# Patient Record
Sex: Female | Born: 1995 | Race: White | Hispanic: No | Marital: Single | Smoking: Never smoker
Health system: Southern US, Community
[De-identification: ages and names within clinical notes are randomized; demographics above are authoritative.]

## PROBLEM LIST (undated history)

## (undated) DIAGNOSIS — H269 Unspecified cataract: Secondary | ICD-10-CM

## (undated) DIAGNOSIS — R55 Syncope and collapse: Secondary | ICD-10-CM

## (undated) DIAGNOSIS — F329 Major depressive disorder, single episode, unspecified: Secondary | ICD-10-CM

## (undated) DIAGNOSIS — F419 Anxiety disorder, unspecified: Secondary | ICD-10-CM

## (undated) DIAGNOSIS — F32A Depression, unspecified: Secondary | ICD-10-CM

## (undated) HISTORY — DX: Unspecified cataract: H26.9

## (undated) HISTORY — DX: Anxiety disorder, unspecified: F41.9

## (undated) HISTORY — DX: Syncope and collapse: R55

## (undated) HISTORY — PX: WISDOM TOOTH EXTRACTION: SHX21

## (undated) HISTORY — DX: Depression, unspecified: F32.A

## (undated) HISTORY — PX: NO PAST SURGERIES: SHX2092

---
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
## 2017-06-13 ENCOUNTER — Ambulatory Visit (INDEPENDENT_AMBULATORY_CARE_PROVIDER_SITE_OTHER): Payer: Managed Care, Other (non HMO) | Admitting: Family Medicine

## 2017-06-13 ENCOUNTER — Encounter: Payer: Self-pay | Admitting: Family Medicine

## 2017-06-13 VITALS — BP 100/62 | HR 86 | Temp 98.7°F | Resp 18 | Ht 63.39 in | Wt 96.2 lb

## 2017-06-13 DIAGNOSIS — G8929 Other chronic pain: Secondary | ICD-10-CM

## 2017-06-13 DIAGNOSIS — F411 Generalized anxiety disorder: Secondary | ICD-10-CM

## 2017-06-13 DIAGNOSIS — M25561 Pain in right knee: Secondary | ICD-10-CM | POA: Diagnosis not present

## 2017-06-13 DIAGNOSIS — M25562 Pain in left knee: Secondary | ICD-10-CM

## 2017-06-13 DIAGNOSIS — R634 Abnormal weight loss: Secondary | ICD-10-CM | POA: Diagnosis not present

## 2017-06-13 DIAGNOSIS — R42 Dizziness and giddiness: Secondary | ICD-10-CM

## 2017-06-13 MED ORDER — ESCITALOPRAM OXALATE 5 MG PO TABS: 5.0000 mg | ORAL_TABLET | Freq: Every day | ORAL | 5 refills | Status: DC

## 2017-06-13 MED ORDER — MELOXICAM 7.5 MG PO TABS: 7.5000 mg | ORAL_TABLET | Freq: Every day | ORAL | 2 refills | Status: DC

## 2017-06-13 NOTE — Patient Instructions (Addendum)
     IF you received an x-ray today, you will receive an invoice from [facility]. Please contact [facility] at [phone] with questions or concerns regarding your invoice.   IF you received labwork today, you will receive an invoice from [facility]. Please contact [facility] at [phone] with questions or concerns regarding your invoice.   Our billing staff will not be able to assist you with questions regarding bills from these companies.  You will be contacted with the lab results as soon as they are available. The fastest way to get your results is to activate your My Chart account. Instructions are located on the last page of this paperwork. If you have not heard from us regarding the results in 2 weeks, please contact this office.     Recommend: Small frequent meals with protein and complex carb.  60 grams of protein per day.  [email or web address]  Drink 3 liters of fluid daily. Every 1 - 2 hours you should drink approximately 16 ounces of fluid. Drink 8 - 16 ounces before getting out of bed in the morning; something with electrolytes (G2, Propel, Pedialyte, for example) is preferable. Decrease your caffeine intake to 8 ounces of daily. Caffeine acts as a diuretic and eliminates fluids that you've worked hard to take in.   You should have 3 - 4 grams of sodium in your diet daily. One teaspoon of salt = 2300mg . Place 1 tsp of salt in a bag and sprinkle on your food throughout the day. Remember a typical American diet (including processed foods) has approximately 3400mg  of sodium daily.   Compression Socks/Stockings:  Wear throughout the day while you are upright (standing or walking). Stocking pressures range from 10-2225mmHg (over the counter) to 30-7540mmHg and higher (prescription strength). You may prefer beginning with the compression socks or stockings at a lower pressure and advancing to the higher/prescription strength later. Putting them on first thing in the  morning is ideal. Remember to remove them prior to lying down or going to bed.  Consider abdominal binder.   Monitor your blood pressure and heart rate 1 - 2 times daily. Record the time of day, position you are in (lying, sitting, standing) and any symptoms that you may be experiencing.

## 2017-06-13 NOTE — Progress Notes (Signed)
Subjective:    Patient ID: Pamela Farmer, female    DOB: 1996/06/15, 21 y.o.   MRN: [id number]  06/13/2017  Weight Loss (follow-up) and Dizziness   HPI This 21 y.o. female presents for evaluation of weight loss and dizziness.  Has been working a lot.  Was working at an [employer] where pt worked a lot.  Now working at [employer] where running around constantly at [employer].  Lightheaded and dizziness.  Also wanted a second opinion.  Has been passing out and dizzy spells; onset since elementary school.  S/p [facility].   Recommend: Small frequent meals with protein and complex carbs.  (breakfast bowls,  Drink 3 liters of fluid daily. Decrease cafeine intake to 8 ounces per day. 3-4 grams of sodium in your diet daily. Wear compression stockings daily throughout the day. Consider abdominal binder.   Check BP with pulse twice daily; not journaling. Complete echocardiogram.  With cardiology,underwent tilt table and negative; no cardiological source of syncope.  Tried to make pt pass out; did not pass out.  Pill under tongue.  Hand in ice.  No induced syncopal event.  Will pass out with ear piercing; will pass out with hip dislocation; will hit funny bone at work and passes out.  Passes out with vaccinations.    S/p neurology consultation at [facility].  Performed exam in office.  Asked about head trauma; does hit head multiple with syncope.  No EEG.  No imaging or MRI. Checked sensation in extremities.  January or February 2018.    Daily dizziness occurs whenever going to bed.  Will get really dizzy; occurs around 8:00pm or 8:30pm; still moving around.  Dizziness is really hard to explain.  Can occur at other times of day.  Last week had to lay down with severe dizziness; that day occurred at 10:30am when getting ready for work.  One month, had a really bad dizzy spell duration 20 minutes.  Temperature decreased to 92-93 degrees. Occurred after getting out of shower and going into cold  room.   Headaches and shakiness.  Fogginess.  Will get cold and shaking.  Not able to think clear. With syncopal event, not witnessed seizure activity; no tongue biting; +incontinence urine; no fecal incontinence.  A lot of nausea and fatigue.    B:Jimmy [employer] eggs, cheese, bacon, sausage, potatoes.  340 calories, protein not sure, sodium not sure Snack: bagel with cream cheese, crackers peanut butter or cheese; apples and peanut butter Lunch: sandwich tomatoes, 2-3 deli meat, american cheese, chips salt and vinegar. Snack:chips or small; Supper: pot roast, celery, carrots, potatoes, garlic.   Snack:  Crackers or velveta soft break.  Sometimes eating is a a struggle.  Working excessively. Got in habit of missng lunch or breakfast. Has a 5:00am-1:00pm.  Stake n Shake is 24 hour. Still in that habit.  Work is so hectic.   BP Readings from Last 3 Encounters:  06/13/17 100/62  09/25/16 122/72  11/08/15 114/68   Wt Readings from Last 3 Encounters:  06/13/17 96 lb 3.2 oz (43.6 kg) (2 %, Z= -2.12)*  09/25/16 107 lb (48.5 kg) (14 %, Z= -1.08)*  11/08/15 119 lb (54 kg) (43 %, Z= -0.18)*   * Growth percentiles are based on CDC 2-20 Years data.   Immunization History  Administered Date(s) Administered  . DTaP 08/27/1998, 10/27/1998, 12/29/1998, 12/30/1999, 08/06/2003  . HPV Quadrivalent 10/17/2010, 01/05/2011, 07/11/2011  . Hepatitis A 10/17/2010, 07/11/2011  . Hepatitis B 12/29/1998, 05/16/1999, 12/30/1999  .  HiB (PRP-OMP) 08/27/1998, 10/27/1998, 07/13/1999  . IPV 08/27/1998, 10/27/1998, 07/13/1999, 08/06/2003  . MMR 07/13/1999, 08/06/2003  . Meningococcal Conjugate 07/11/2011  . Pneumococcal-Unspecified 05/16/1999, 12/30/1999  . Td 07/16/2009  . Tdap 07/16/2009  . Varicella 07/13/1999, 08/06/2003      Review of Systems  Constitutional: Negative for chills, diaphoresis, fatigue and fever.  Eyes: Negative for visual disturbance.  Respiratory: Negative for cough and shortness of  breath.   Cardiovascular: Negative for chest pain, palpitations and leg swelling.  Gastrointestinal: Negative for abdominal pain, constipation, diarrhea, nausea and vomiting.  Endocrine: Negative for cold intolerance, heat intolerance, polydipsia, polyphagia and polyuria.  Neurological: Negative for dizziness, tremors, seizures, syncope, facial asymmetry, speech difficulty, weakness, light-headedness, numbness and headaches.    Past Medical History:  Diagnosis Date  . Anxiety    Past Surgical History:  Procedure Laterality Date  . NO PAST SURGERIES    . WISDOM TOOTH EXTRACTION     No Known Allergies Current Outpatient Prescriptions  Medication Sig Dispense Refill  . etonogestrel (IMPLANON) 68 MG IMPL implant Inject into the skin.    . meloxicam (MOBIC) 7.5 MG tablet Take 1 tablet (7.5 mg total) by mouth daily. 30 tablet 2  . escitalopram (LEXAPRO) 5 MG tablet Take 1 tablet (5 mg total) by mouth at bedtime. 30 tablet 5   No current facility-administered medications for this visit.    Social History   Social History  . Marital status: Single    Spouse name: N/A  . Number of children: N/A  . Years of education: 12th Grade   Occupational History  . Full Time Student     [employer]   Social History Main Topics  . Smoking status: Never Smoker  . Smokeless tobacco: Never Used  . Alcohol use No  . Drug use: No  . Sexual activity: Not on file   Other Topics Concern  . Not on file   Social History Narrative  . No narrative on file   Family History  Problem Relation Age of Onset  . Bipolar disorder Mother   . Macular degeneration Mother   . Mental illness Mother   . Hypertension Father   . ADD / ADHD Father   . Hyperlipidemia Father   . Healthy Sister   . Mental illness Sister   . Healthy Brother   . Cancer Maternal Grandmother   . Mental illness Maternal Grandmother   . Cancer Maternal Grandfather   . Hyperlipidemia Paternal Grandmother   . Stroke  Paternal Grandfather        Objective:    BP 100/62   Pulse 86   Temp 98.7 F (37.1 C) (Oral)   Resp 18   Ht 5' 3.39" (1.61 m)   Wt 96 lb 3.2 oz (43.6 kg)   LMP 06/11/2017 (Approximate)   SpO2 100%   BMI 16.83 kg/m  Physical Exam  Constitutional: She is oriented to person, place, and time. She appears well-developed and well-nourished. No distress.  HENT:  Head: Normocephalic and atraumatic.  Right Ear: External ear normal.  Left Ear: External ear normal.  Nose: Nose normal.  Mouth/Throat: Oropharynx is clear and moist.  Eyes: Pupils are equal, round, and reactive to light. Conjunctivae and EOM are normal.  Neck: Normal range of motion. Neck supple. Carotid bruit is not present. No thyromegaly present.  Cardiovascular: Normal rate, regular rhythm, normal heart sounds and intact distal pulses.  Exam reveals no gallop and no friction rub.   No murmur heard. Pulmonary/Chest: Effort  normal and breath sounds normal. She has no wheezes. She has no rales.  Abdominal: Soft. Bowel sounds are normal. She exhibits no distension and no mass. There is no tenderness. There is no rebound and no guarding.  Lymphadenopathy:    She has no cervical adenopathy.  Neurological: She is alert and oriented to person, place, and time. No cranial nerve deficit. She exhibits normal muscle tone. Coordination normal.  Skin: Skin is warm and dry. No rash noted. She is not diaphoretic. No erythema. No pallor.  Psychiatric: She has a normal mood and affect. Her behavior is normal. Judgment and thought content normal.   Results for orders placed or performed in visit on 09/25/16  CBC with Differential/Platelet  Result Value Ref Range   WBC 6.2 4.5 - 13.0 K/uL   RBC 4.48 3.80 - 5.10 MIL/uL   Hemoglobin 13.6 11.5 - 15.3 g/dL   HCT 39.8 34.0 - 46.0 %   MCV 88.8 78.0 - 98.0 fL   MCH 30.4 25.0 - 35.0 pg   MCHC 34.2 31.0 - 36.0 g/dL   RDW 13.1 11.0 - 15.0 %   Platelets 397 140 - 400 K/uL   MPV 8.5 7.5 -  12.5 fL   Neutro Abs 3,410 1,800 - 8,000 cells/uL   Lymphs Abs 2,108 1,200 - 5,200 cells/uL   Monocytes Absolute 496 200 - 900 cells/uL   Eosinophils Absolute 124 15 - 500 cells/uL   Basophils Absolute 62 0 - 200 cells/uL   Neutrophils Relative % 55 %   Lymphocytes Relative 34 %   Monocytes Relative 8 %   Eosinophils Relative 2 %   Basophils Relative 1 %   Smear Review Criteria for review not met   Comprehensive metabolic panel  Result Value Ref Range   Sodium 139 135 - 146 mmol/L   Potassium 4.3 3.8 - 5.1 mmol/L   Chloride 107 98 - 110 mmol/L   CO2 23 20 - 31 mmol/L   Glucose, Bld 74 65 - 99 mg/dL   BUN 12 7 - 20 mg/dL   Creat 0.63 0.50 - 1.00 mg/dL   Total Bilirubin 0.9 0.2 - 1.1 mg/dL   Alkaline Phosphatase 50 47 - 176 U/L   AST 15 12 - 32 U/L   ALT 8 5 - 32 U/L   Total Protein 7.0 6.3 - 8.2 g/dL   Albumin 4.7 3.6 - 5.1 g/dL   Calcium 9.7 8.9 - 10.4 mg/dL  Hemoglobin A1c  Result Value Ref Range   Hgb A1c MFr Bld 4.6 <5.7 %   Mean Plasma Glucose 85 mg/dL  TSH  Result Value Ref Range   TSH 3.63 0.50 - 4.30 mIU/L   Depression screen [facility] 2/9 06/13/2017 09/25/2016  Decreased Interest 0 0  Down, Depressed, Hopeless 0 0  PHQ - 2 Score 0 0       Assessment & Plan:   1. Loss of weight   2. Generalized anxiety disorder   3. Chronic pain of both knees   4. Dizziness    -weight loss with change in jobs in the past six months; reviewed food intake; discussed the need to increase high protein and high fat meals and snacks throughout the day. -pt admits to chronic ongoing anxiety; agreeable to trial of Lexapro 81m daily and referral for counseling. -[facility] with recommendations.  Copy of consultation note provided to patient and mother.  [facility]  Obtain records.  Orders Placed This Encounter  Procedures  . Ambulatory referral to Psychology    Referral  Priority:   Routine    Referral Type:   Psychiatric    Referral Reason:   Specialty Services Required    Requested Specialty:   Psychology    Number of Visits Requested:   1   Meds ordered this encounter  Medications  . escitalopram (LEXAPRO) 5 MG tablet    Sig: Take 1 tablet (5 mg total) by mouth at bedtime.    Dispense:  30 tablet    Refill:  5  . meloxicam (MOBIC) 7.5 MG tablet    Sig: Take 1 tablet (7.5 mg total) by mouth daily.    Dispense:  30 tablet    Refill:  2    Return in about 4 weeks (around 07/11/2017) for recheck weight gain.   Kristi Elayne Guerin, M.D. Primary Care at [facility] [address] [place], [place]  [phone] phone [phone] fax

## 2017-07-10 ENCOUNTER — Ambulatory Visit: Payer: Managed Care, Other (non HMO) | Admitting: Family Medicine

## 2017-07-18 ENCOUNTER — Ambulatory Visit (INDEPENDENT_AMBULATORY_CARE_PROVIDER_SITE_OTHER): Payer: Managed Care, Other (non HMO) | Admitting: Family Medicine

## 2017-07-18 ENCOUNTER — Encounter: Payer: Self-pay | Admitting: Family Medicine

## 2017-07-18 VITALS — BP 99/65 | HR 87 | Temp 99.1°F | Resp 16 | Ht 64.0 in | Wt 100.8 lb

## 2017-07-18 DIAGNOSIS — R000 Tachycardia, unspecified: Secondary | ICD-10-CM

## 2017-07-18 DIAGNOSIS — R42 Dizziness and giddiness: Secondary | ICD-10-CM

## 2017-07-18 DIAGNOSIS — F419 Anxiety disorder, unspecified: Secondary | ICD-10-CM

## 2017-07-18 DIAGNOSIS — F5104 Psychophysiologic insomnia: Secondary | ICD-10-CM | POA: Diagnosis not present

## 2017-07-18 DIAGNOSIS — R55 Syncope and collapse: Secondary | ICD-10-CM

## 2017-07-18 MED ORDER — ESCITALOPRAM OXALATE 10 MG PO TABS: 10.0000 mg | ORAL_TABLET | Freq: Every day | ORAL | 2 refills | Status: DC

## 2017-07-18 NOTE — Patient Instructions (Signed)
     IF you received an x-ray today, you will receive an invoice from [facility]. Please contact [facility] at [phone] with questions or concerns regarding your invoice.   IF you received labwork today, you will receive an invoice from [facility]. Please contact [facility] at [phone] with questions or concerns regarding your invoice.   Our billing staff will not be able to assist you with questions regarding bills from these companies.  You will be contacted with the lab results as soon as they are available. The fastest way to get your results is to activate your My Chart account. Instructions are located on the last page of this paperwork. If you have not heard from us regarding the results in 2 weeks, please contact this office.     

## 2017-07-18 NOTE — Progress Notes (Signed)
Subjective:    Patient ID: Pamela Farmer, female    DOB: 24-Oct-1996, 21 y.o.   MRN: [id number]  07/18/2017  Follow-up (trying to gain weight); Anxiety; and Dizziness   HPI This 21 y.o. female presents for evaluation of weight loss, dizziness, syncopal events.  Doing really well since last visit; feels that anxiety 40% improved on Lexapro 65m daily.  Dizziness also improved until suffered a syncopal event on 07/14/17 while with boyfriend.  Has kept a food diary since last visit; wearing compression stockings while at work; has gained weight and very pleased.  No side effects to Lexapro other than diarrhea which is tolerable to patient.    Dinner: lasagna Breakfast: veveta soft bakes. Lunch: bagel; noodles.   Eating is a struggle.   Went to [employer]; not very hungry.  Later, ate it later.    Suffering with insomnia.  Chronic issue.  Onset in middle school. Bad issue for patient for years. Does not fall asleep until 2:00am. Tries to go to bed at 8:00pm; must be at work at 5:00am.  Works 5:00am-1:00pm.  Will sleep until 8:00am.  Has occurred four times in past six months.   Falling asleep better with Lexapro.  Not making drowsy.   Pushed back counseling right now.    Started wearing compression stockings at work.   Syncopal event on 07/13/17; took 20 minutes to recover.  Going around town early and then went swimming.  After a couple of hours, making dinner; making plate; the prior thirty minutes, started feeling dizziness.  Hit a wave and crashed to floor.   Getting 60 grams protein per day. Checking Bp and has been normal 120s/60-80s.  Heart rate fluctuates. Can have episodes where heart rate will be 120 at rest.  Occurs often.  Has one day where BP stable yet heart rate 141; was not doing anything strenuous; was dizzy that day.  No event monitor or holter monitor with cardiology consultation in 12/2016; did not return for echo.   No follow-up appointments scheduled with cardiology.  BP  Readings from Last 3 Encounters:  07/18/17 99/65  06/13/17 100/62  09/25/16 122/72   Wt Readings from Last 3 Encounters:  07/18/17 100 lb 12.8 oz (45.7 kg) (5 %, Z= -1.70)*  06/13/17 96 lb 3.2 oz (43.6 kg) (2 %, Z= -2.12)*  09/25/16 107 lb (48.5 kg) (14 %, Z= -1.08)*   * Growth percentiles are based on CDC 2-20 Years data.   Immunization History  Administered Date(s) Administered  . DTaP 08/27/1998, 10/27/1998, 12/29/1998, 12/30/1999, 08/06/2003  . HPV Quadrivalent 10/17/2010, 01/05/2011, 07/11/2011  . Hepatitis A 10/17/2010, 07/11/2011  . Hepatitis B 12/29/1998, 05/16/1999, 12/30/1999  . HiB (PRP-OMP) 08/27/1998, 10/27/1998, 07/13/1999  . IPV 08/27/1998, 10/27/1998, 07/13/1999, 08/06/2003  . MMR 07/13/1999, 08/06/2003  . Meningococcal Conjugate 07/11/2011  . Pneumococcal-Unspecified 05/16/1999, 12/30/1999  . Td 07/16/2009  . Tdap 07/16/2009  . Varicella 07/13/1999, 08/06/2003    Review of Systems  Constitutional: Negative for chills, diaphoresis, fatigue and fever.  Eyes: Negative for visual disturbance.  Respiratory: Negative for cough and shortness of breath.   Cardiovascular: Positive for palpitations. Negative for chest pain and leg swelling.  Gastrointestinal: Negative for abdominal pain, constipation, diarrhea, nausea and vomiting.  Endocrine: Negative for cold intolerance, heat intolerance, polydipsia, polyphagia and polyuria.  Neurological: Positive for dizziness and syncope. Negative for tremors, seizures, facial asymmetry, speech difficulty, weakness, light-headedness, numbness and headaches.  Psychiatric/Behavioral: Positive for dysphoric mood and sleep disturbance. Negative for self-injury. The  patient is nervous/anxious.     Past Medical History:  Diagnosis Date  . Anxiety    Past Surgical History:  Procedure Laterality Date  . NO PAST SURGERIES    . WISDOM TOOTH EXTRACTION     No Known Allergies  Social History   Social History  . Marital status:  Single    Spouse name: N/A  . Number of children: N/A  . Years of education: 12th Grade   Occupational History  . Full Time Student     [employer]   Social History Main Topics  . Smoking status: Never Smoker  . Smokeless tobacco: Never Used  . Alcohol use No  . Drug use: No  . Sexual activity: Not on file   Other Topics Concern  . Not on file   Social History Narrative  . No narrative on file   Family History  Problem Relation Age of Onset  . Bipolar disorder Mother   . Macular degeneration Mother   . Mental illness Mother   . Hypertension Father   . ADD / ADHD Father   . Hyperlipidemia Father   . Healthy Sister   . Mental illness Sister   . Healthy Brother   . Cancer Maternal Grandmother   . Mental illness Maternal Grandmother   . Cancer Maternal Grandfather   . Hyperlipidemia Paternal Grandmother   . Stroke Paternal Grandfather        Objective:    BP 99/65   Pulse 87   Temp 99.1 F (37.3 C) (Oral)   Resp 16   Ht '5\' 4"'  (1.626 m)   Wt 100 lb 12.8 oz (45.7 kg)   SpO2 99%   BMI 17.30 kg/m  Physical Exam  Constitutional: She is oriented to person, place, and time. She appears well-developed and well-nourished. No distress.  HENT:  Head: Normocephalic and atraumatic.  Right Ear: External ear normal.  Left Ear: External ear normal.  Nose: Nose normal.  Mouth/Throat: Oropharynx is clear and moist.  Eyes: Pupils are equal, round, and reactive to light. Conjunctivae and EOM are normal.  Neck: Normal range of motion. Neck supple. Carotid bruit is not present. No thyromegaly present.  Cardiovascular: Normal rate, regular rhythm, normal heart sounds and intact distal pulses.  Exam reveals no gallop and no friction rub.   No murmur heard. Pulmonary/Chest: Effort normal and breath sounds normal. She has no wheezes. She has no rales.  Abdominal: Soft. Bowel sounds are normal. She exhibits no distension and no mass. There is no tenderness. There is no  rebound and no guarding.  Lymphadenopathy:    She has no cervical adenopathy.  Neurological: She is alert and oriented to person, place, and time. No cranial nerve deficit. She exhibits normal muscle tone. Coordination normal.  Skin: Skin is warm and dry. No rash noted. She is not diaphoretic. No erythema. No pallor.  Psychiatric: She has a normal mood and affect. Her behavior is normal. Judgment and thought content normal.   Results for orders placed or performed in visit on 09/25/16  CBC with Differential/Platelet  Result Value Ref Range   WBC 6.2 4.5 - 13.0 K/uL   RBC 4.48 3.80 - 5.10 MIL/uL   Hemoglobin 13.6 11.5 - 15.3 g/dL   HCT 39.8 34.0 - 46.0 %   MCV 88.8 78.0 - 98.0 fL   MCH 30.4 25.0 - 35.0 pg   MCHC 34.2 31.0 - 36.0 g/dL   RDW 13.1 11.0 - 15.0 %   Platelets 397  140 - 400 K/uL   MPV 8.5 7.5 - 12.5 fL   Neutro Abs 3,410 1,800 - 8,000 cells/uL   Lymphs Abs 2,108 1,200 - 5,200 cells/uL   Monocytes Absolute 496 200 - 900 cells/uL   Eosinophils Absolute 124 15 - 500 cells/uL   Basophils Absolute 62 0 - 200 cells/uL   Neutrophils Relative % 55 %   Lymphocytes Relative 34 %   Monocytes Relative 8 %   Eosinophils Relative 2 %   Basophils Relative 1 %   Smear Review Criteria for review not met   Comprehensive metabolic panel  Result Value Ref Range   Sodium 139 135 - 146 mmol/L   Potassium 4.3 3.8 - 5.1 mmol/L   Chloride 107 98 - 110 mmol/L   CO2 23 20 - 31 mmol/L   Glucose, Bld 74 65 - 99 mg/dL   BUN 12 7 - 20 mg/dL   Creat 0.63 0.50 - 1.00 mg/dL   Total Bilirubin 0.9 0.2 - 1.1 mg/dL   Alkaline Phosphatase 50 47 - 176 U/L   AST 15 12 - 32 U/L   ALT 8 5 - 32 U/L   Total Protein 7.0 6.3 - 8.2 g/dL   Albumin 4.7 3.6 - 5.1 g/dL   Calcium 9.7 8.9 - 10.4 mg/dL  Hemoglobin A1c  Result Value Ref Range   Hgb A1c MFr Bld 4.6 <5.7 %   Mean Plasma Glucose 85 mg/dL  TSH  Result Value Ref Range   TSH 3.63 0.50 - 4.30 mIU/L   No results found. Depression screen [facility] 2/9  07/18/2017 06/13/2017 09/25/2016  Decreased Interest 0 0 0  Down, Depressed, Hopeless 0 0 0  PHQ - 2 Score 0 0 0   Fall Risk  07/18/2017 06/13/2017 09/25/2016  Falls in the past year? Yes No No  Number falls in past yr: 2 or more - -  Injury with Fall? Yes - -    GAD 7 : Generalized Anxiety Score 07/18/2017  Nervous, Anxious, on Edge 1  Control/stop worrying 1  Worry too much - different things 1  Trouble relaxing 3  Restless 2  Easily annoyed or irritable 2  Afraid - awful might happen 1  Total GAD 7 Score 11  Anxiety Difficulty Somewhat difficult        Assessment & Plan:   1. Tachycardia   2. Syncope, unspecified syncope type   3. Anxiety    -new onset tachycardia while at rest with heart rates ranging from 100-140; refer back to cardiology for echo and holter/event monitor.  Repeat labs at next visit; last labs in 08/2016. -anxiety/depression improved with Lexapro; increase to 21m daily.   -consider addition of Remeron qhs for insomnia and weight gain and depression at future visits if insomnia does not resolve with Lexapro.  -recurrent syncopal event since last visit.  [facility]   Orders Placed This Encounter  Procedures  . Ambulatory referral to Cardiology    Referral Priority:   Routine    Referral Type:   Consultation    Referral Reason:   Specialty Services Required    Requested Specialty:   Cardiology    Number of Visits Requested:   1   Meds ordered this encounter  Medications  . escitalopram (LEXAPRO) 10 MG tablet    Sig: Take 1 tablet (10 mg total) by mouth at bedtime.    Dispense:  30 tablet    Refill:  2    Return in about  4 weeks (around 08/15/2017) for recheck weight, anxiety, heart rate.   Harrel Ferrone Elayne Guerin, M.D. Primary Care at [facility] [address] [place], [place]  [address] [phone] phone [phone] fax

## 2017-07-19 DIAGNOSIS — R55 Syncope and collapse: Secondary | ICD-10-CM | POA: Insufficient documentation

## 2017-07-19 DIAGNOSIS — R000 Tachycardia, unspecified: Secondary | ICD-10-CM | POA: Insufficient documentation

## 2017-07-19 DIAGNOSIS — F5104 Psychophysiologic insomnia: Secondary | ICD-10-CM | POA: Insufficient documentation

## 2017-08-14 ENCOUNTER — Telehealth: Payer: Self-pay | Admitting: Family Medicine

## 2017-08-14 NOTE — Telephone Encounter (Signed)
Pt called requesting we send her cardiology referral to another office besides [facility] where we originally sent her. The pt and her mother said the prices for [facility] were charging hospital fees/facility to do an echo and it was expensive. They also wanted to try to go to a more local office if possible. I have switched the referral to [facility] and advised the pt of this. Please let me know if I need to send somewhere else. Thanks!

## 2017-08-15 NOTE — Telephone Encounter (Signed)
Noted. Agree with referral to [facility].

## 2017-08-21 ENCOUNTER — Ambulatory Visit: Payer: Managed Care, Other (non HMO) | Admitting: Family Medicine

## 2017-09-07 ENCOUNTER — Other Ambulatory Visit: Payer: Self-pay | Admitting: Family Medicine

## 2017-09-07 ENCOUNTER — Encounter: Payer: Self-pay | Admitting: Family Medicine

## 2017-09-07 DIAGNOSIS — G8929 Other chronic pain: Secondary | ICD-10-CM

## 2017-09-07 DIAGNOSIS — M25561 Pain in right knee: Principal | ICD-10-CM

## 2017-09-07 DIAGNOSIS — M25562 Pain in left knee: Principal | ICD-10-CM

## 2017-09-25 ENCOUNTER — Ambulatory Visit (INDEPENDENT_AMBULATORY_CARE_PROVIDER_SITE_OTHER): Payer: BLUE CROSS/BLUE SHIELD | Admitting: Psychology

## 2017-09-25 DIAGNOSIS — F331 Major depressive disorder, recurrent, moderate: Secondary | ICD-10-CM

## 2017-09-25 DIAGNOSIS — F411 Generalized anxiety disorder: Secondary | ICD-10-CM | POA: Diagnosis not present

## 2017-10-09 ENCOUNTER — Ambulatory Visit: Payer: Self-pay | Admitting: Psychology

## 2017-10-23 ENCOUNTER — Ambulatory Visit: Payer: BLUE CROSS/BLUE SHIELD | Admitting: Psychology

## 2017-11-16 ENCOUNTER — Ambulatory Visit: Payer: BLUE CROSS/BLUE SHIELD | Admitting: Psychology

## 2017-11-28 ENCOUNTER — Ambulatory Visit: Payer: Self-pay | Admitting: Psychology

## 2017-12-17 ENCOUNTER — Ambulatory Visit: Payer: Self-pay | Admitting: Psychology

## 2018-03-19 ENCOUNTER — Ambulatory Visit: Payer: BLUE CROSS/BLUE SHIELD | Admitting: Family Medicine

## 2018-03-19 ENCOUNTER — Other Ambulatory Visit: Payer: Self-pay

## 2018-03-19 ENCOUNTER — Encounter: Payer: Self-pay | Admitting: Family Medicine

## 2018-03-19 VITALS — BP 102/58 | HR 61 | Temp 99.0°F | Resp 16 | Ht 64.57 in | Wt 101.0 lb

## 2018-03-19 DIAGNOSIS — R55 Syncope and collapse: Secondary | ICD-10-CM

## 2018-03-19 DIAGNOSIS — R000 Tachycardia, unspecified: Secondary | ICD-10-CM | POA: Diagnosis not present

## 2018-03-19 DIAGNOSIS — R42 Dizziness and giddiness: Secondary | ICD-10-CM | POA: Diagnosis not present

## 2018-03-19 DIAGNOSIS — F5104 Psychophysiologic insomnia: Secondary | ICD-10-CM | POA: Diagnosis not present

## 2018-03-19 DIAGNOSIS — F419 Anxiety disorder, unspecified: Secondary | ICD-10-CM | POA: Diagnosis not present

## 2018-03-19 MED ORDER — ESCITALOPRAM OXALATE 20 MG PO TABS: 20.0000 mg | ORAL_TABLET | Freq: Every day | ORAL | 5 refills | Status: DC

## 2018-03-19 NOTE — Patient Instructions (Addendum)
[facility] in August.  Other options: [facility] Kerby Nora, MD   IF you received an x-ray today, you will receive an invoice from [facility]. Please contact [facility] at [phone] with questions or concerns regarding your invoice.   IF you received labwork today, you will receive an invoice from [facility]. Please contact [facility] at [phone] with questions or concerns regarding your invoice.   Our billing staff will not be able to assist you with questions regarding bills from these companies.  You will be contacted with the lab results as soon as they are available. The fastest way to get your results is to activate your My Chart account. Instructions are located on the last page of this paperwork. If you have not heard from [place] regarding the results in 2 weeks, please contact this office.      Living With Anxiety After being diagnosed with an anxiety disorder, you may be relieved to know why you have felt or behaved a certain way. It is natural to also feel overwhelmed about the treatment ahead and what it will mean for your life. With care and support, you can manage this condition and recover from it. How to cope with anxiety Dealing with stress Stress is your body's reaction to life changes and events, both good and bad. Stress can last just a few hours or it can be ongoing. Stress can play a major role in anxiety, so it is important to learn both how to cope with stress and how to think about it differently. Talk with your health care provider or a counselor to learn more about stress reduction. He or she may suggest some stress reduction techniques, such as:  Music therapy. This can include creating or listening to music that you enjoy and that inspires you.  Mindfulness-based meditation. This involves being aware of your normal breaths, rather than trying to control your breathing. It can be done while sitting or  walking.  Centering prayer. This is a kind of meditation that involves focusing on a word, phrase, or sacred image that is meaningful to you and that brings you peace.  Deep breathing. To do this, expand your stomach and inhale slowly through your nose. Hold your breath for 3-5 seconds. Then exhale slowly, allowing your stomach muscles to relax.  Self-talk. This is a skill where you identify thought patterns that lead to anxiety reactions and correct those thoughts.  Muscle relaxation. This involves tensing muscles then relaxing them.  Choose a stress reduction technique that fits your lifestyle and personality. Stress reduction techniques take time and practice. Set aside 5-15 minutes a day to do them. Therapists can offer training in these techniques. The training may be covered by some insurance plans. Other things you can do to manage stress include:  Keeping a stress diary. This can help you learn what triggers your stress and ways to control your response.  Thinking about how you respond to certain situations. You may not be able to control everything, but you can control your reaction.  Making time for activities that help you relax, and not feeling guilty about spending your time in this way.  Therapy combined with coping and stress-reduction skills provides the best chance for successful treatment. Medicines Medicines can help ease symptoms. Medicines for anxiety include:  Anti-anxiety drugs.  Antidepressants.  Beta-blockers.  Medicines may be used as the main treatment for anxiety disorder, along with therapy, or if other treatments are not working.  Medicines should be prescribed by a health care provider. Relationships Relationships can play a big part in helping you recover. Try to spend more time connecting with trusted friends and family members. Consider going to couples counseling, taking family education classes, or going to family therapy. Therapy can help you and  others better understand the condition. How to recognize changes in your condition Everyone has a different response to treatment for anxiety. Recovery from anxiety happens when symptoms decrease and stop interfering with your daily activities at home or work. This may mean that you will start to:  Have better concentration and focus.  Sleep better.  Be less irritable.  Have more energy.  Have improved memory.  It is important to recognize when your condition is getting worse. Contact your health care provider if your symptoms interfere with home or work and you do not feel like your condition is improving. Where to find help and support: You can get help and support from these sources:  Self-help groups.  Online and [employer] organizations.  A trusted spiritual leader.  Couples counseling.  Family education classes.  Family therapy.  Follow these instructions at home:  Eat a healthy diet that includes plenty of vegetables, fruits, whole grains, low-fat dairy products, and lean protein. Do not eat a lot of foods that are high in solid fats, added sugars, or salt.  Exercise. Most adults should do the following: ? Exercise for at least 150 minutes each week. The exercise should increase your heart rate and make you sweat (moderate-intensity exercise). ? Strengthening exercises at least twice a week.  Cut down on caffeine, tobacco, alcohol, and other potentially harmful substances.  Get the right amount and quality of sleep. Most adults need 7-9 hours of sleep each night.  Make choices that simplify your life.  Take over-the-counter and prescription medicines only as told by your health care provider.  Avoid caffeine, alcohol, and certain over-the-counter cold medicines. These may make you feel worse. Ask your pharmacist which medicines to avoid.  Keep all follow-up visits as told by your health care provider. This is important. Questions to ask your health care  provider  Would I benefit from therapy?  How often should I follow up with a health care provider?  How long do I need to take medicine?  Are there any long-term side effects of my medicine?  Are there any alternatives to taking medicine? Contact a health care provider if:  You have a hard time staying focused or finishing daily tasks.  You spend many hours a day feeling worried about everyday life.  You become exhausted by worry.  You start to have headaches, feel tense, or have nausea.  You urinate more than normal.  You have diarrhea. Get help right away if:  You have a racing heart and shortness of breath.  You have thoughts of hurting yourself or others. If you ever feel like you may hurt yourself or others, or have thoughts about taking your own life, get help right away. You can go to your nearest emergency department or call:  Your local emergency services (911 in the [place]  A suicide crisis helpline, such as the National Suicide Prevention Lifeline at (734)665-03191-(620)396-5407. This is open 24-hours a day.  Summary  Taking steps to deal with stress can help calm you.  Medicines cannot cure anxiety disorders, but they can help ease symptoms.  Family, friends, and partners can play a big part in helping you recover from an anxiety disorder.  This information is not intended to replace advice given to you by your health care provider. Make sure you discuss any questions you have with your health care provider. Document Released: 11/07/2016 Document Revised: 11/07/2016 Document Reviewed: 11/07/2016 Elsevier Interactive Patient Education  [employer].

## 2018-03-19 NOTE — Progress Notes (Signed)
Subjective:    Patient ID: Pamela Farmer, female    DOB: [birth year], 22 y.o.   MRN: [id number]  03/19/2018  Dizziness (follow-up fron 8/22 pt states Pamela Farmer still ahs the dizziness and some other symptoms Pamela Farmer would like to discuess ) and Medication Management (pt would like to possibly change the dose of her lexapro ( increase) )    HPI This 22 y.o. female presents for evaluation of vasovagal syncope and anxiety.   S/p cardiology consultation; stockings; heart monitor; failed tilt table test. Ice bucket.  No syncope with tilt table. Echo wnl. Stockings not helpful. No follow-up since last follow-up. Drinking more gatorade.  Drinking more water.   Still dizzy; no improvement. Last syncopal event 1-2 since last visit eight months. Daily dizzy ;syncope is rare; 3-4 times per year; previously 1-2 times per year.  More frequent in past few years. No skipping meals. Appetite has actually increased in the past month.   Lexapro nightly; could benefit from increased dosage. Much improved. Horrible anxiety; small panic attack during meeting at work.  Went to see psychiatrist three months ago; work conflicted. Sleep is way way better.   30% improved.    GAD 7 : Generalized Anxiety Score 03/19/2018 07/18/2017  Nervous, Anxious, on Edge 3 1  Control/stop worrying 1 1  Worry too much - different things 1 1  Trouble relaxing 1 3  Restless 1 2  Easily annoyed or irritable 1 2  Afraid - awful might happen 1 1  Total GAD 7 Score 9 11  Anxiety Difficulty - Somewhat difficult     BP Readings from Last 3 Encounters:  03/19/18 (!) 102/58  07/18/17 99/65  06/13/17 100/62   Wt Readings from Last 3 Encounters:  03/19/18 101 lb (45.8 kg) (4 %, Z= -1.73)*  07/18/17 100 lb 12.8 oz (45.7 kg) (4 %, Z= -1.70)*  06/13/17 96 lb 3.2 oz (43.6 kg) (2 %, Z= -2.12)*   * Growth percentiles are based on CDC (Girls, 2-20 Years) data.   Immunization History  Administered Date(s) Administered  . DTaP  08/27/1998, 10/27/1998, 12/29/1998, 12/30/1999, 08/06/2003  . HPV Quadrivalent 10/17/2010, 01/05/2011, 07/11/2011  . Hepatitis A 10/17/2010, 07/11/2011  . Hepatitis A, Ped/Adol-2 Dose 10/17/2010, 07/11/2011  . Hepatitis B 12/29/1998, 05/16/1999, 12/30/1999  . HiB (PRP-OMP) 08/27/1998, 10/27/1998, 07/13/1999  . IPV 08/27/1998, 10/27/1998, 07/13/1999, 08/06/2003  . Influenza Split 10/17/2010  . MMR 07/13/1999, 08/06/2003  . Meningococcal Conjugate 07/11/2011  . Pneumococcal-Unspecified 05/16/1999, 12/30/1999  . Td 07/16/2009  . Tdap 07/16/2009  . Varicella 07/13/1999, 08/06/2003    Review of Systems  Constitutional: Negative for chills, diaphoresis, fatigue and fever.  Eyes: Negative for visual disturbance.  Respiratory: Negative for cough and shortness of breath.   Cardiovascular: Negative for chest pain, palpitations and leg swelling.  Gastrointestinal: Negative for abdominal pain, constipation, diarrhea, nausea and vomiting.  Endocrine: Negative for cold intolerance, heat intolerance, polydipsia, polyphagia and polyuria.  Neurological: Negative for dizziness, tremors, seizures, syncope, facial asymmetry, speech difficulty, weakness, light-headedness, numbness and headaches.    Past Medical History:  Diagnosis Date  . Anxiety    Past Surgical History:  Procedure Laterality Date  . NO PAST SURGERIES    . WISDOM TOOTH EXTRACTION     No Known Allergies Current Outpatient Medications on File Prior to Visit  Medication Sig Dispense Refill  . etonogestrel (IMPLANON) 68 MG IMPL implant Inject into the skin.    . meloxicam (MOBIC) 7.5 MG tablet Take 1 tablet (7.5 mg total)  by mouth daily. 30 tablet 2   No current facility-administered medications on file prior to visit.    Social History   Socioeconomic History  . Marital status: Single    Spouse name: Not on file  . Number of children: Not on file  . Years of education: 12th Grade  . Highest education level: Not on file    Occupational History  . Occupation: Full Time Student    Comment: [employer]  Social Needs  . Financial resource strain: Not on file  . Food insecurity:    Worry: Not on file    Inability: Not on file  . Transportation needs:    Medical: Not on file    Non-medical: Not on file  Tobacco Use  . Smoking status: Never Smoker  . Smokeless tobacco: Never Used  Substance and Sexual Activity  . Alcohol use: No  . Drug use: No  . Sexual activity: Not on file  Lifestyle  . Physical activity:    Days per week: Not on file    Minutes per session: Not on file  . Stress: Not on file  Relationships  . Social connections:    Talks on phone: Not on file    Gets together: Not on file    Attends religious service: Not on file    Active member of club or organization: Not on file    Attends meetings of clubs or organizations: Not on file    Relationship status: Not on file  . Intimate partner violence:    Fear of current or ex partner: Not on file    Emotionally abused: Not on file    Physically abused: Not on file    Forced sexual activity: Not on file  Other Topics Concern  . Not on file  Social History Narrative  . Not on file   Family History  Problem Relation Age of Onset  . Bipolar disorder Mother   . Macular degeneration Mother   . Mental illness Mother   . Hypertension Father   . ADD / ADHD Father   . Hyperlipidemia Father   . Healthy Sister   . Mental illness Sister   . Healthy Brother   . Cancer Maternal Grandmother   . Mental illness Maternal Grandmother   . Cancer Maternal Grandfather   . Hyperlipidemia Paternal Grandmother   . Stroke Paternal Grandfather        Objective:    BP (!) 102/58   Pulse 61   Temp 99 F (37.2 C) (Oral)   Resp 16   Ht 5' 4.57" (1.64 m)   Wt 101 lb (45.8 kg)   SpO2 97%   BMI 17.03 kg/m  Physical Exam  Constitutional: Pamela Farmer is oriented to person, place, and time. Pamela Farmer appears well-developed and well-nourished. No  distress.  HENT:  Head: Normocephalic and atraumatic.  Right Ear: External ear normal.  Left Ear: External ear normal.  Nose: Nose normal.  Mouth/Throat: Oropharynx is clear and moist.  Eyes: Pupils are equal, round, and reactive to light. Conjunctivae and EOM are normal.  Neck: Normal range of motion. Neck supple. Carotid bruit is not present. No thyromegaly present.  Cardiovascular: Normal rate, regular rhythm, normal heart sounds and intact distal pulses. Exam reveals no gallop and no friction rub.  No murmur heard. Pulmonary/Chest: Effort normal and breath sounds normal. Pamela Farmer has no wheezes. Pamela Farmer has no rales.  Abdominal: Soft. Bowel sounds are normal. Pamela Farmer exhibits no distension and no mass. There is no  tenderness. There is no rebound and no guarding.  Lymphadenopathy:    Pamela Farmer has no cervical adenopathy.  Neurological: Pamela Farmer is alert and oriented to person, place, and time. Pamela Farmer displays normal reflexes. No cranial nerve deficit or sensory deficit. Pamela Farmer exhibits normal muscle tone. Coordination normal.  Skin: Skin is warm and dry. No rash noted. Pamela Farmer is not diaphoretic. No erythema. No pallor.  Psychiatric: Pamela Farmer has a normal mood and affect. Her behavior is normal. Judgment and thought content normal.   No results found. Depression screen [facility] 2/9 07/18/2017 06/13/2017 09/25/2016  Decreased Interest 0 0 0  Down, Depressed, Hopeless 0 0 0  PHQ - 2 Score 0 0 0   Fall Risk  07/18/2017 06/13/2017 09/25/2016  Falls in the past year? Yes No No  Number falls in past yr: 2 or more - -  Injury with Fall? Yes - -        Assessment & Plan:   1. Vasovagal syncope   2. Anxiety   3. Dizziness   4. Tachycardia   5. Psychophysiological insomnia     Vasovagal syncope: stable at this time.  Increasing fluid intake during the day; wearing compression stockings; continues to suffer with dizziness throughout the day yet manageable at this time; s/p cardiology consultation.  Anxiety and insomnia:  improving yet persistent; increase Lexapro to 52m daily.  Highly recommend psychotherapy and regular exercise for anxiety management.  No orders of the defined types were placed in this encounter.  Meds ordered this encounter  Medications  . escitalopram (LEXAPRO) 20 MG tablet    Sig: Take 1 tablet (20 mg total) by mouth at bedtime.    Dispense:  30 tablet    Refill:  5    Return in about 6 months (around 09/18/2018) for recheck.   Madisen Ludvigsen MElayne Guerin M.D. [facility]  [phone]

## 2018-04-12 ENCOUNTER — Encounter: Payer: Self-pay | Admitting: Family Medicine

## 2018-04-17 ENCOUNTER — Encounter: Payer: Self-pay | Admitting: Family Medicine

## 2019-01-13 ENCOUNTER — Telehealth: Payer: Self-pay | Admitting: Advanced Practice Midwife

## 2019-01-13 NOTE — Telephone Encounter (Signed)
Pt coming in on 01/24/2019 at 9:50 am with Pamela Farmer for nexplanon removal and reinsertion

## 2019-01-24 ENCOUNTER — Ambulatory Visit: Payer: BLUE CROSS/BLUE SHIELD | Admitting: Advanced Practice Midwife

## 2019-02-25 NOTE — Telephone Encounter (Signed)
Pt no show for appointment.

## 2019-10-29 ENCOUNTER — Other Ambulatory Visit: Payer: Self-pay

## 2019-10-29 DIAGNOSIS — Z20822 Contact with and (suspected) exposure to covid-19: Secondary | ICD-10-CM

## 2019-10-31 LAB — NOVEL CORONAVIRUS, NAA: SARS-CoV-2, NAA: DETECTED — AB

## 2020-01-05 ENCOUNTER — Ambulatory Visit (INDEPENDENT_AMBULATORY_CARE_PROVIDER_SITE_OTHER): Payer: Self-pay | Admitting: Psychology

## 2020-01-05 DIAGNOSIS — F3281 Premenstrual dysphoric disorder: Secondary | ICD-10-CM

## 2020-01-05 DIAGNOSIS — F411 Generalized anxiety disorder: Secondary | ICD-10-CM

## 2020-01-19 ENCOUNTER — Ambulatory Visit (INDEPENDENT_AMBULATORY_CARE_PROVIDER_SITE_OTHER): Payer: Self-pay | Admitting: Psychology

## 2020-01-19 DIAGNOSIS — F3289 Other specified depressive episodes: Secondary | ICD-10-CM

## 2020-01-19 DIAGNOSIS — F411 Generalized anxiety disorder: Secondary | ICD-10-CM

## 2020-02-02 ENCOUNTER — Ambulatory Visit (INDEPENDENT_AMBULATORY_CARE_PROVIDER_SITE_OTHER): Payer: 59 | Admitting: Psychology

## 2020-02-02 DIAGNOSIS — F3289 Other specified depressive episodes: Secondary | ICD-10-CM

## 2020-02-02 DIAGNOSIS — F411 Generalized anxiety disorder: Secondary | ICD-10-CM

## 2020-02-09 ENCOUNTER — Ambulatory Visit (INDEPENDENT_AMBULATORY_CARE_PROVIDER_SITE_OTHER): Payer: 59 | Admitting: Psychology

## 2020-02-09 DIAGNOSIS — F411 Generalized anxiety disorder: Secondary | ICD-10-CM

## 2020-02-09 DIAGNOSIS — F3289 Other specified depressive episodes: Secondary | ICD-10-CM | POA: Diagnosis not present

## 2020-02-23 ENCOUNTER — Ambulatory Visit (INDEPENDENT_AMBULATORY_CARE_PROVIDER_SITE_OTHER): Payer: 59 | Admitting: Psychology

## 2020-02-23 DIAGNOSIS — F411 Generalized anxiety disorder: Secondary | ICD-10-CM

## 2020-02-23 DIAGNOSIS — F3289 Other specified depressive episodes: Secondary | ICD-10-CM

## 2020-03-01 ENCOUNTER — Ambulatory Visit (INDEPENDENT_AMBULATORY_CARE_PROVIDER_SITE_OTHER): Payer: 59 | Admitting: Psychology

## 2020-03-01 DIAGNOSIS — F3289 Other specified depressive episodes: Secondary | ICD-10-CM | POA: Diagnosis not present

## 2020-03-01 DIAGNOSIS — F411 Generalized anxiety disorder: Secondary | ICD-10-CM

## 2020-03-08 ENCOUNTER — Ambulatory Visit (INDEPENDENT_AMBULATORY_CARE_PROVIDER_SITE_OTHER): Payer: 59 | Admitting: Psychology

## 2020-03-08 DIAGNOSIS — F3289 Other specified depressive episodes: Secondary | ICD-10-CM | POA: Diagnosis not present

## 2020-03-08 DIAGNOSIS — F411 Generalized anxiety disorder: Secondary | ICD-10-CM | POA: Diagnosis not present

## 2020-03-15 ENCOUNTER — Ambulatory Visit (INDEPENDENT_AMBULATORY_CARE_PROVIDER_SITE_OTHER): Payer: 59 | Admitting: Psychology

## 2020-03-15 DIAGNOSIS — F411 Generalized anxiety disorder: Secondary | ICD-10-CM

## 2020-03-15 DIAGNOSIS — F3289 Other specified depressive episodes: Secondary | ICD-10-CM | POA: Diagnosis not present

## 2020-03-16 NOTE — Progress Notes (Addendum)
New patient visit    Patient: Pamela Farmer   DOB: 10-16-1996   24 y.o. Female  MRN: [id number] Visit Date: 03/22/2020  Today's healthcare provider: Jairo Ben, FNP  Subjective:    Alton Revere McClurkin,acting as a scribe for Beverely Pace Flinchum, FNP.,have documented all relevant documentation on the behalf of Jairo Ben, FNP,as directed by  Jairo Ben, FNP while in the presence of Jairo Ben, FNP.  Chief Complaint  Patient presents with  . New Patient (Initial Visit)    Pamela Farmer is a 24 y.o. female who presents today as a new patient to establish care.  HPI  Patient reports she was taking Lexapro 20 mg daily for anxiety and depression and had to come off due to no health insurance at the time. Patient would like to started back taking the medication. She is currently see a Counsellor  [employer].  Patient would like to get her Implanon removed (no showed OBGYN appointment on 01/24/2019, per note in chart) after having placed 5 years ago and would like to be off of birthcontrol she reports as she feels it makes her moods worse. She has been off Lexapro x 1 year as well.  She reports not been on a relationship currently. She is also having a irregular menstrual cycle.  Bowel movements normal.  had this current Nexplanon  for around 5 years. wants to consider alternative birth control, she feels the Nexplanon has increased her anxiety. She does not want to become pregnant. has spotting - no normal cycle since implanon.   She reports history of lightheadedness, dizziness, and pres syncope syncopal episodes. She has seen [facility] in [facility] past. She wants second opinion and reports she has hit her head multiple times in the past. Last syncopal episode was 5 months ago. She denies any new or worsening symptoms. Tilt table test was negative. No cardiology source of syncope was noted in 2018. No witnessed seizure  activity or biting of tongue.  She passes out with ear piercing's and immunizations.  She has seen [facility] in January 2018 due to head trauma with syncope. No EEG. No imaging or MRI. She wants a second opinion.  She continues to have persistent dizziness's that occurs  intermittently.    She has a hard time gaining weight she reports. Appettite decreased. She continues to work a lot and forgets to eat at times. Work is hectic. Has done this for " years". Trying to eat more frequent small meals 6 to 9 times during day. She has no history of binging. She does report some anorexia tendencies, she did better when on the lexapro in the past.  She declined nutritionist referral at this time.   She has not had a PAP smear, she is due now age 24.    Patient  denies any fever, body aches,chills, rash, chest pain, shortness of breath, nausea, vomiting, or diarrhea.     Past Medical History:  Diagnosis Date  . Anxiety    Past Surgical History:  Procedure Laterality Date  . NO PAST SURGERIES    . WISDOM TOOTH EXTRACTION     Family Status  Relation Name Status  . Mother  Alive  . Father  Alive  . Sister  Alive  . Brother  Alive  . MGM  Deceased  . MGF  Deceased  . PGM  Alive  . PGF  Deceased   Family History  Problem Relation  Age of Onset  . Bipolar disorder Mother   . Macular degeneration Mother   . Mental illness Mother   . Hypertension Father   . ADD / ADHD Father   . Hyperlipidemia Father   . Healthy Sister   . Mental illness Sister   . Healthy Brother   . Cancer Maternal Grandmother   . Mental illness Maternal Grandmother   . Cancer Maternal Grandfather   . Hyperlipidemia Paternal Grandmother   . Stroke Paternal Grandfather    Social History   Socioeconomic History  . Marital status: Single    Spouse name: Not on file  . Number of children: Not on file  . Years of education: 12th Grade  . Highest education level: Not on file  Occupational History  .  Occupation: Full Time Student    Comment: [employer]  Tobacco Use  . Smoking status: Never Smoker  . Smokeless tobacco: Never Used  Substance and Sexual Activity  . Alcohol use: No  . Drug use: No  . Sexual activity: Not on file  Other Topics Concern  . Not on file  Social History Narrative  . Not on file   Social Determinants of Health   Financial Resource Strain:   . Difficulty of Paying Living Expenses:   Food Insecurity:   . Worried About Programme researcher, broadcasting/film/video in the Last Year:   . Barista in the Last Year:   Transportation Needs:   . Freight forwarder (Medical):   Marland Kitchen Lack of Transportation (Non-Medical):   Physical Activity:   . Days of Exercise per Week:   . Minutes of Exercise per Session:   Stress:   . Feeling of Stress :   Social Connections:   . Frequency of Communication with Friends and Family:   . Frequency of Social Gatherings with Friends and Family:   . Attends Religious Services:   . Active Member of Clubs or Organizations:   . Attends Banker Meetings:   Marland Kitchen Marital Status:    Outpatient Medications Prior to Visit  Medication Sig  . etonogestrel (IMPLANON) 68 MG IMPL implant Inject into the skin.  Marland Kitchen escitalopram (LEXAPRO) 20 MG tablet Take 1 tablet (20 mg total) by mouth at bedtime. (Patient not taking: Reported on 03/22/2020)   No facility-administered medications prior to visit.   No Known Allergies  Patient Care Team: Flinchum, Eula Fried, FNP as PCP - General (Family Medicine)  Review of Systems  Constitutional: Positive for appetite change and fatigue.  HENT: Positive for tinnitus. Negative for ear discharge and ear pain.   Eyes: Negative for discharge, redness, itching and visual disturbance.  Respiratory: Negative for cough, chest tightness, shortness of breath and wheezing.   Cardiovascular: Negative for chest pain and leg swelling.  Gastrointestinal: Negative for abdominal distention, abdominal pain,  constipation, diarrhea and nausea.  Endocrine: Negative for cold intolerance, polydipsia and polyuria.  Genitourinary: Negative for decreased urine volume, difficulty urinating, frequency and pelvic pain.  Musculoskeletal: Positive for arthralgias.  Allergic/Immunologic: Negative for environmental allergies and food allergies.  Neurological: Positive for headaches. Negative for dizziness, syncope and numbness.  Hematological: Negative for adenopathy.  Psychiatric/Behavioral: Positive for decreased concentration. The patient is nervous/anxious.     Last CBC Lab Results  Component Value Date   WBC 6.2 09/25/2016   HGB 13.6 09/25/2016   HCT 39.8 09/25/2016   MCV 88.8 09/25/2016   MCH 30.4 09/25/2016   RDW 13.1 09/25/2016   PLT 397 09/25/2016  Last metabolic panel Lab Results  Component Value Date   GLUCOSE 74 09/25/2016   NA 139 09/25/2016   K 4.3 09/25/2016   CL 107 09/25/2016   CO2 23 09/25/2016   BUN 12 09/25/2016   CREATININE 0.63 09/25/2016   CALCIUM 9.7 09/25/2016   PROT 7.0 09/25/2016   ALBUMIN 4.7 09/25/2016   BILITOT 0.9 09/25/2016   ALKPHOS 50 09/25/2016   AST 15 09/25/2016   ALT 8 09/25/2016   Last lipids No results found for: CHOL, HDL, LDLCALC, LDLDIRECT, TRIG, CHOLHDL Last hemoglobin A1c Lab Results  Component Value Date   HGBA1C 4.6 09/25/2016   Last thyroid functions Lab Results  Component Value Date   TSH 3.63 09/25/2016   Last vitamin D No results found for: 25OHVITD2, 25OHVITD3, VD25OH Last vitamin B12 and Folate No results found for: VITAMINB12, FOLATE     Objective:    BP 116/64 (BP Location: Right Arm, Patient Position: Sitting, Cuff Size: Normal)   Pulse (!) 104   Temp (!) 97.1 F (36.2 C) (Temporal)   Ht 5\' 4"  (1.626 m)   Wt 103 lb 6.4 oz (46.9 kg)   SpO2 98%   BMI 17.75 kg/m    Filed Weights   03/22/20 0907  Weight: 103 lb 6.4 oz (46.9 kg)    Physical Exam Vitals reviewed.  Constitutional:      General: She is not in  acute distress.    Appearance: She is well-developed. She is not ill-appearing, toxic-appearing or diaphoretic.     Interventions: She is not intubated.    Comments: Underweight   Patient appers well, not sickly. Speaking in complete sentences. Patient moves on and off of exam table and in room without difficulty. Gait is normal in hall and in room. Patient is oriented to person place time and situation. Patient answers questions appropriately and engages eye contact and verbal dialect with provider.   HENT:     Head: Normocephalic and atraumatic.     Right Ear: Tympanic membrane, ear canal and external ear normal. There is no impacted cerumen.     Left Ear: Tympanic membrane, ear canal and external ear normal. There is no impacted cerumen.     Nose: Nose normal. No congestion or rhinorrhea.     Mouth/Throat:     Mouth: Mucous membranes are moist.     Pharynx: No oropharyngeal exudate or posterior oropharyngeal erythema.  Eyes:     General: Lids are normal. No scleral icterus.       Right eye: No discharge.        Left eye: No discharge.     Extraocular Movements: Extraocular movements intact.     Conjunctiva/sclera: Conjunctivae normal.     Right eye: Right conjunctiva is not injected. No exudate or hemorrhage.    Left eye: Left conjunctiva is not injected. No exudate or hemorrhage.    Pupils: Pupils are equal, round, and reactive to light.  Neck:     Thyroid: No thyroid mass or thyromegaly.     Vascular: Normal carotid pulses. No carotid bruit, hepatojugular reflux or JVD.     Trachea: Trachea and phonation normal. No tracheal tenderness or tracheal deviation.     Meningeal: Brudzinski's sign and Kernig's sign absent.  Cardiovascular:     Rate and Rhythm: Normal rate and regular rhythm.     Pulses: Normal pulses.          Radial pulses are 2+ on the right side and 2+ on the left side.  Dorsalis pedis pulses are 2+ on the right side and 2+ on the left side.       Posterior  tibial pulses are 2+ on the right side and 2+ on the left side.     Heart sounds: Normal heart sounds, S1 normal and S2 normal. Heart sounds not distant. No murmur. No friction rub. No gallop.   Pulmonary:     Effort: Pulmonary effort is normal. No tachypnea, bradypnea, accessory muscle usage or respiratory distress. She is not intubated.     Breath sounds: Normal breath sounds. No stridor. No wheezing or rales.  Chest:     Chest wall: No tenderness.  Abdominal:     General: Bowel sounds are normal. There is no distension or abdominal bruit.     Palpations: Abdomen is soft. There is no shifting dullness, fluid wave, hepatomegaly, splenomegaly, mass or pulsatile mass.     Tenderness: There is no abdominal tenderness. There is no right CVA tenderness, left CVA tenderness, guarding or rebound.     Hernia: No hernia is present.  Musculoskeletal:        General: No swelling, tenderness, deformity or signs of injury. Normal range of motion.     Cervical back: Full passive range of motion without pain, normal range of motion and neck supple. No edema, erythema or rigidity. No spinous process tenderness or muscular tenderness. Normal range of motion.     Right lower leg: No edema.     Left lower leg: No edema.  Lymphadenopathy:     Head:     Right side of head: No submental, submandibular, tonsillar, preauricular, posterior auricular or occipital adenopathy.     Left side of head: No submental, submandibular, tonsillar, preauricular, posterior auricular or occipital adenopathy.     Cervical: No cervical adenopathy.     Right cervical: No superficial, deep or posterior cervical adenopathy.    Left cervical: No superficial, deep or posterior cervical adenopathy.     Upper Body:     Right upper body: No supraclavicular or pectoral adenopathy.     Left upper body: No supraclavicular or pectoral adenopathy.  Skin:    General: Skin is warm and dry.     Capillary Refill: Capillary refill takes less  than 2 seconds.     Coloration: Skin is not pale.     Findings: No abrasion, bruising, burn, ecchymosis, erythema, lesion, petechiae or rash.     Nails: There is no clubbing.  Neurological:     General: No focal deficit present.     Mental Status: She is alert and oriented to person, place, and time.     GCS: GCS eye subscore is 4. GCS verbal subscore is 5. GCS motor subscore is 6.     Cranial Nerves: No cranial nerve deficit.     Sensory: No sensory deficit.     Motor: No weakness, tremor, atrophy, abnormal muscle tone or seizure activity.     Coordination: Coordination normal.     Gait: Gait normal.     Deep Tendon Reflexes: Reflexes are normal and symmetric. Reflexes normal. Babinski sign absent on the right side. Babinski sign absent on the left side.     Reflex Scores:      Tricep reflexes are 2+ on the right side and 2+ on the left side.      Bicep reflexes are 2+ on the right side and 2+ on the left side.      Brachioradialis reflexes are 2+ on the right  side and 2+ on the left side.      Patellar reflexes are 2+ on the right side and 2+ on the left side.      Achilles reflexes are 2+ on the right side and 2+ on the left side. Psychiatric:        Mood and Affect: Mood normal.        Speech: Speech normal.        Behavior: Behavior normal.        Thought Content: Thought content normal.        Judgment: Judgment normal.    Results for orders placed or performed in visit on 03/22/20 (from the past 24 hour(s))  POCT urine pregnancy     Status: None   Collection Time: 03/22/20 10:16 AM  Result Value Ref Range   Preg Test, Ur Negative Negative  POCT urinalysis dipstick     Status: Abnormal   Collection Time: 03/22/20 10:17 AM  Result Value Ref Range   Color, UA Dark Yellow    Clarity, UA Cloudy    Glucose, UA Negative Negative   Bilirubin, UA Negative    Ketones, UA Negative    Spec Grav, UA 1.025 1.010 - 1.025   Blood, UA Non-Hemolyzed trace    pH, UA 6.0 5.0 - 8.0    Protein, UA Positive (A) Negative   Urobilinogen, UA 0.2 0.2 or 1.0 E.U./dL   Nitrite, UA Positive    Leukocytes, UA Trace (A) Negative   Appearance     Odor    sent for culture, she is asymptomatic.   No results found for any visits on 03/22/20.    Assessment & Plan:    1. Annual physical exam - CBC with Differential/Platelet - Comprehensive Metabolic Panel (CMET)  2. Vitamin D insufficiency - VITAMIN D 25 Hydroxy (Vit-D Deficiency, Fractures)  3. Screening for thyroid disorder - TSH  4. Screening cholesterol level - Lipid Panel w/o Chol/HDL Ratio  5. Vasovagal episode- history of   6. Syncope, vasovagal - Ambulatory referral to Neurology - Ambulatory referral to Cardiac Electrophysiology  7. Syncope, unspecified syncope type - Ambulatory referral to Neurology - Ambulatory referral to Cardiac Electrophysiology  8. Depression, recurrent ([place])  9. Menstrual spotting - POCT urine pregnancy - POCT urinalysis dipstick  10. Screening for blood or protein in urine - POCT urinalysis dipstick  11. Underweight on examination  12. Underweight  13. Body mass index (BMI) of 19 or less in adult  She declined STD testing.  See urine POCT above - will send for culture she is asymptomatic. Clean catch was performed with urine specimen obtained. Call if symptoms. Urine culture sent.  Will be scheduled with Dr. Jacinto Reap or Tawanna Sat to have Implanon removed left arm and counseled on birth control options at this visit. She has had Implanon for a total of 8 years she reports with most recent being placed around 5 years ago. She seems to be leaning more towards a copper IUD or progestin only options however she does not feel she will do well with oral contraceptives as she will forget to take.  Patches also discussed. She will think on this and discuss at visit to remove Implanon.   She will return in one month to follow up on Depression.    [facility] Visit [facility] 03/22/[facility]  PHQ-9 Total Score  23     She has been off Lexapro for 1 year, with weight will restart at 10 mg daily  and recheck in one month. She will also have a PAP smear at that time. Will also recheck weight. Keep diary of diet daily.   Orders Placed This Encounter  Procedures  . Urine Culture  . CBC with Differential/Platelet  . Comprehensive Metabolic Panel (CMET)  . TSH  . Lipid Panel w/o Chol/HDL Ratio  . VITAMIN D 25 Hydroxy (Vit-D Deficiency, Fractures)  . Ambulatory referral to Neurology    Referral Priority:   Routine    Referral Type:   Consultation    Referral Reason:   Specialty Services Required    Requested Specialty:   Neurology    Number of Visits Requested:   1  . Ambulatory referral to Cardiac Electrophysiology    Referral Priority:   Routine    Referral Type:   Consultation    Referral Reason:   Specialty Services Required    Referred to Provider:   [facility] SalviaKlein, Steven C, MD    Requested Specialty:   Cardiology    Number of Visits Requested:   1  . POCT urine pregnancy  . POCT urinalysis dipstick   Above referrals discussed/ Black box warning for Lexapro discussed and precautions.  Diet discussed and increased caloric intake.  Will check labs. Declined nutritionist ref feral at this time.    Return in about 1 month (around 04/21/2020), or if symptoms worsen or fail to improve, for at any time for any worsening symptoms, Go to Emergency room/ urgent care if worse.    IBeverely Pace, Michelle Smith Flinchum, FNP, have reviewed all documentation for this visit. The documentation on 03/22/20 for the exam, diagnosis, procedures, and orders are all accurate and complete. The entirety of the information documented in the History of Present Illness, Review of Systems and Physical Exam were personally obtained by me. Portions of this information were initially documented by the  Certified Medical Assistant whose name is documented in Epic and reviewed by me for thoroughness and accuracy.   I have personally performed the exam and reviewed the chart and it is accurate to the best of my knowledge.  Museum/gallery conservatorDragon technology has been used and any errors in dictation or transcription are unintentional.  Eula FriedMichelle S. Flinchum FNP-C  [facility]   Jairo BenMichelle Smith Flinchum, FNP  [facility] [phone] (phone) [phone] (fax)  [facility]  Addressed acute and or chronic medical problems today requiring  31 minutes reviewing patients medical record,labs, counseling patient regarding patient's conditions, any medications, answering questions regarding health, and coordination of care as needed. After visit summary patient given copy and reviewed.

## 2020-03-22 ENCOUNTER — Ambulatory Visit (INDEPENDENT_AMBULATORY_CARE_PROVIDER_SITE_OTHER): Payer: 59 | Admitting: Psychology

## 2020-03-22 ENCOUNTER — Other Ambulatory Visit: Payer: Self-pay

## 2020-03-22 ENCOUNTER — Ambulatory Visit (INDEPENDENT_AMBULATORY_CARE_PROVIDER_SITE_OTHER): Payer: 59 | Admitting: Adult Health

## 2020-03-22 ENCOUNTER — Encounter: Payer: Self-pay | Admitting: Adult Health

## 2020-03-22 VITALS — BP 116/64 | HR 104 | Temp 97.1°F | Ht 64.0 in | Wt 103.4 lb

## 2020-03-22 DIAGNOSIS — F3289 Other specified depressive episodes: Secondary | ICD-10-CM | POA: Diagnosis not present

## 2020-03-22 DIAGNOSIS — F411 Generalized anxiety disorder: Secondary | ICD-10-CM | POA: Diagnosis not present

## 2020-03-22 DIAGNOSIS — R82998 Other abnormal findings in urine: Secondary | ICD-10-CM

## 2020-03-22 DIAGNOSIS — Z1389 Encounter for screening for other disorder: Secondary | ICD-10-CM

## 2020-03-22 DIAGNOSIS — Z681 Body mass index (BMI) 19 or less, adult: Secondary | ICD-10-CM | POA: Insufficient documentation

## 2020-03-22 DIAGNOSIS — Z13220 Encounter for screening for lipoid disorders: Secondary | ICD-10-CM | POA: Diagnosis not present

## 2020-03-22 DIAGNOSIS — E559 Vitamin D deficiency, unspecified: Secondary | ICD-10-CM | POA: Diagnosis not present

## 2020-03-22 DIAGNOSIS — N920 Excessive and frequent menstruation with regular cycle: Secondary | ICD-10-CM | POA: Insufficient documentation

## 2020-03-22 DIAGNOSIS — Z1329 Encounter for screening for other suspected endocrine disorder: Secondary | ICD-10-CM

## 2020-03-22 DIAGNOSIS — R55 Syncope and collapse: Secondary | ICD-10-CM

## 2020-03-22 DIAGNOSIS — Z0000 Encounter for general adult medical examination without abnormal findings: Secondary | ICD-10-CM | POA: Diagnosis not present

## 2020-03-22 DIAGNOSIS — F339 Major depressive disorder, recurrent, unspecified: Secondary | ICD-10-CM

## 2020-03-22 DIAGNOSIS — R636 Underweight: Secondary | ICD-10-CM

## 2020-03-22 DIAGNOSIS — R3989 Other symptoms and signs involving the genitourinary system: Secondary | ICD-10-CM | POA: Insufficient documentation

## 2020-03-22 LAB — POCT URINALYSIS DIPSTICK
Bilirubin, UA: NEGATIVE
Glucose, UA: NEGATIVE
Ketones, UA: NEGATIVE
Nitrite, UA: POSITIVE
Protein, UA: POSITIVE — AB
Spec Grav, UA: 1.025 (ref 1.010–1.025)
Urobilinogen, UA: 0.2 E.U./dL
pH, UA: 6 (ref 5.0–8.0)

## 2020-03-22 LAB — POCT URINE PREGNANCY: Preg Test, Ur: NEGATIVE

## 2020-03-22 MED ORDER — ESCITALOPRAM OXALATE 10 MG PO TABS: 10.0000 mg | ORAL_TABLET | Freq: Every day | ORAL | 0 refills | Status: DC

## 2020-03-22 NOTE — Patient Instructions (Signed)
Health Maintenance, Female Adopting a healthy lifestyle and getting preventive care are important in promoting health and wellness. Ask your health care provider about:  The right schedule for you to have regular tests and exams.  Things you can do on your own to prevent diseases and keep yourself healthy. What should I know about diet, weight, and exercise? Eat a healthy diet   Eat a diet that includes plenty of vegetables, fruits, low-fat dairy products, and lean protein.  Do not eat a lot of foods that are high in solid fats, added sugars, or sodium. Maintain a healthy weight Body mass index (BMI) is used to identify weight problems. It estimates body fat based on height and weight. Your health care provider can help determine your BMI and help you achieve or maintain a healthy weight. Get regular exercise Get regular exercise. This is one of the most important things you can do for your health. Most adults should:  Exercise for at least 150 minutes each week. The exercise should increase your heart rate and make you sweat (moderate-intensity exercise).  Do strengthening exercises at least twice a week. This is in addition to the moderate-intensity exercise.  Spend less time sitting. Even light physical activity can be beneficial. Watch cholesterol and blood lipids Have your blood tested for lipids and cholesterol at 22 years of age, then have this test every 5 years. Have your cholesterol levels checked more often if:  Your lipid or cholesterol levels are high.  You are older than 22 years of age.  You are at high risk for heart disease. What should I know about cancer screening? Depending on your health history and family history, you may need to have cancer screening at various ages. This may include screening for:  Breast cancer.  Cervical cancer.  Colorectal cancer.  Skin cancer.  Lung cancer. What should I know about heart disease, diabetes, and high blood  pressure? Blood pressure and heart disease  High blood pressure causes heart disease and increases the risk of stroke. This is more likely to develop in people who have high blood pressure readings, are of African descent, or are overweight.  Have your blood pressure checked: ? Every 3-5 years if you are 18-39 years of age. ? Every year if you are 40 years old or older. Diabetes Have regular diabetes screenings. This checks your fasting blood sugar level. Have the screening done:  Once every three years after age 40 if you are at a normal weight and have a low risk for diabetes.  More often and at a younger age if you are overweight or have a high risk for diabetes. What should I know about preventing infection? Hepatitis B If you have a higher risk for hepatitis B, you should be screened for this virus. Talk with your health care provider to find out if you are at risk for hepatitis B infection. Hepatitis C Testing is recommended for:  Everyone born from 1945 through 1965.  Anyone with known risk factors for hepatitis C. Sexually transmitted infections (STIs)  Get screened for STIs, including gonorrhea and chlamydia, if: ? You are sexually active and are younger than 22 years of age. ? You are older than 22 years of age and your health care provider tells you that you are at risk for this type of infection. ? Your sexual activity has changed since you were last screened, and you are at increased risk for chlamydia or gonorrhea. Ask your health care provider if   you are at risk.  Ask your health care provider about whether you are at high risk for HIV. Your health care provider may recommend a prescription medicine to help prevent HIV infection. If you choose to take medicine to prevent HIV, you should first get tested for HIV. You should then be tested every 3 months for as long as you are taking the medicine. Pregnancy  If you are about to stop having your period (premenopausal) and  you may become pregnant, seek counseling before you get pregnant.  Take 400 to 800 micrograms (mcg) of folic acid every day if you become pregnant.  Ask for birth control (contraception) if you want to prevent pregnancy. Osteoporosis and menopause Osteoporosis is a disease in which the bones lose minerals and strength with aging. This can result in bone fractures. If you are 43 years old or older, or if you are at risk for osteoporosis and fractures, ask your health care provider if you should:  Be screened for bone loss.  Take a calcium or vitamin D supplement to lower your risk of fractures.  Be given hormone replacement therapy (HRT) to treat symptoms of menopause. Follow these instructions at home: Lifestyle  Do not use any products that contain nicotine or tobacco, such as cigarettes, e-cigarettes, and chewing tobacco. If you need help quitting, ask your health care provider.  Do not use street drugs.  Do not share needles.  Ask your health care provider for help if you need support or information about quitting drugs. Alcohol use  Do not drink alcohol if: ? Your health care provider tells you not to drink. ? You are pregnant, may be pregnant, or are planning to become pregnant.  If you drink alcohol: ? Limit how much you use to 0-1 drink a day. ? Limit intake if you are breastfeeding.  Be aware of how much alcohol is in your drink. In the [place] one drink equals one 12 oz bottle of beer (355 mL), one 5 oz glass of wine (148 mL), or one 1 oz glass of hard liquor (44 mL). General instructions  Schedule regular health, dental, and eye exams.  Stay current with your vaccines.  Tell your health care provider if: ? You often feel depressed. ? You have ever been abused or do not feel safe at home. Summary  Adopting a healthy lifestyle and getting preventive care are important in promoting health and wellness.  Follow your health care provider's instructions about healthy  diet, exercising, and getting tested or screened for diseases.  Follow your health care provider's instructions on monitoring your cholesterol and blood pressure. This information is not intended to replace advice given to you by your health care provider. Make sure you discuss any questions you have with your health care provider. Document Revised: 11/06/2018 Document Reviewed: 11/06/2018 Elsevier Patient Education  2020 [employer]. Contraception Choices Contraception, also called birth control, means things to use or ways to try not to get pregnant. Hormonal birth control This kind of birth control uses hormones. Here are some types of hormonal birth control:  A tube that is put under skin of the arm (implant). The tube can stay in for as long as 3 years.  Shots to get every 3 months (injections).  Pills to take every day (birth control pills).  A patch to change 1 time each week for 3 weeks (birth control patch). After that, the patch is taken off for 1 week.  A ring to put in the  vagina. The ring is left in for 3 weeks. Then it is taken out of the vagina for 1 week. Then a new ring is put in.  Pills to take after unprotected sex (emergency birth control pills). Barrier birth control Here are some types of barrier birth control:  A thin covering that is put on the penis before sex (female condom). The covering is thrown away after sex.  A soft, loose covering that is put in the vagina before sex (female condom). The covering is thrown away after sex.  A rubber bowl that sits over the cervix (diaphragm). The bowl must be made for you. The bowl is put into the vagina before sex. The bowl is left in for 6-8 hours after sex. It is taken out within 24 hours.  A small, soft cup that fits over the cervix (cervical cap). The cup must be made for you. The cup can be left in for 6-8 hours after sex. It is taken out within 48 hours.  A sponge that is put into the vagina before sex. It must  be left in for at least 6 hours after sex. It must be taken out within 30 hours. Then it is thrown away.  A chemical that kills or stops sperm from getting into the uterus (spermicide). It may be a pill, cream, jelly, or foam to put in the vagina. The chemical should be used at least 10-15 minutes before sex. IUD (intrauterine) birth control An IUD is a small, T-shaped piece of plastic. It is put inside the uterus. There are two kinds:  Hormone IUD. This kind can stay in for 3-5 years.  Copper IUD. This kind can stay in for 10 years. Permanent birth control Here are some types of permanent birth control:  Surgery to block the fallopian tubes.  Having an insert put into each fallopian tube.  Surgery to tie off the tubes that carry sperm (vasectomy). Natural planning birth control Here are some types of natural planning birth control:  Not having sex on the days the woman could get pregnant.  Using a calendar: ? To keep track of the length of each period. ? To find out what days pregnancy can happen. ? To plan to not have sex on days when pregnancy can happen.  Watching for symptoms of ovulation and not having sex during ovulation. One way the woman can check for ovulation is to check her temperature.  Waiting to have sex until after ovulation. Summary  Contraception, also called birth control, means things to use or ways to try not to get pregnant.  Hormonal methods of birth control include implants, injections, pills, patches, vaginal rings, and emergency birth control pills.  Barrier methods of birth control can include female condoms, female condoms, diaphragms, cervical caps, sponges, and spermicides.  There are two types of IUD (intrauterine device) birth control. An IUD can be put in a woman's uterus to prevent pregnancy for 3-5 years.  Permanent sterilization can be done through a procedure for males, females, or both.  Natural planning methods involve not having sex on  the days when the woman could get pregnant. This information is not intended to replace advice given to you by your health care provider. Make sure you discuss any questions you have with your health care provider. Document Revised: 03/05/2019 Document Reviewed: 11/23/2016 Elsevier Patient Education  2020 [employer].    Intrauterine Device Information An intrauterine device (IUD) is a medical device that is inserted in the uterus  to prevent pregnancy. It is a small, T-shaped device that has one or two nylon strings hanging down from it. The strings hang out of the lower part of the uterus (cervix) to allow for future IUD removal. There are two types of IUDs available:  Hormone IUD. This type of IUD is made of plastic and contains the hormone progestin (synthetic progesterone). A hormone IUD may last 3-5 years.  Copper IUD. This type of IUD has copper wire wrapped around it. A copper IUD may last up to 10 years. How is an IUD inserted? An IUD is inserted through the vagina and placed into the uterus with a minor medical procedure. The exact procedure for IUD insertion may vary among health care providers and hospitals. How does an IUD work? Synthetic progesterone in a hormonal IUD prevents pregnancy by:  Thickening cervical mucus to prevent sperm from entering the uterus.  Thinning the uterine lining to prevent a fertilized egg from being implanted there. Copper in a copper IUD prevents pregnancy by making the uterus and fallopian tubes produce a fluid that kills sperm. What are the advantages of an IUD? Advantages of either type of IUD  It is highly effective in preventing pregnancy.  It is reversible. You can become pregnant shortly after the IUD is removed.  It is low-maintenance and can stay in place for a long time.  There are no estrogen-related side effects.  It can be used when breastfeeding.  It is not associated with weight gain.  It can be inserted right after  childbirth, an abortion, or a miscarriage. Advantages of a hormone IUD  If it is inserted within 7 days of your period starting, it works right after it is inserted. If the hormone IUD is inserted at any other time in your cycle, you will need to use a backup method of birth control for 7 days after insertion.  It can make menstrual periods lighter.  It can reduce menstrual cramping.  It can be used for 3-5 years. Advantages of a copper IUD  It works right after it is inserted.  It can be used as a form of emergency birth control if it is inserted within 5 days after having unprotected sex.  It does not interfere with your body's natural hormones.  It can be used for 10 years. What are the disadvantages of an IUD?  An IUD may cause irregular menstrual bleeding for a period of time after insertion.  You may have pain during insertion and have cramping and vaginal bleeding after insertion.  An IUD may cut the uterus (uterine perforation) when it is inserted. This is rare.  An IUD may cause pelvic inflammatory disease (PID), which is an infection in the uterus and fallopian tubes. This is rare, and it usually happens during the first 20 days after the IUD is inserted.  A copper IUD can make your menstrual flow heavier and more painful. How is an IUD removed?  You will lie on your back with your knees bent and your feet in footrests (stirrups).  A device will be inserted into your vagina to spread apart the vaginal walls (speculum). This will allow your health care provider to see the strings attached to the IUD.  Your health care provider will use a small instrument (forceps) to grasp the IUD strings and pull firmly until the IUD is removed. You may have some discomfort when the IUD is removed. Your health care provider may recommend taking over-the-counter pain relievers, such as  ibuprofen, before the procedure. You may also have minor spotting for a few days after the  procedure. The exact procedure for IUD removal may vary among health care providers and hospitals. Is the IUD right for me? Your health care provider will make sure you are a good candidate for an IUD and will discuss the advantages, disadvantages, and possible side effects with you. Summary  An intrauterine device (IUD) is a medical device that is inserted in the uterus to prevent pregnancy. It is a small, T-shaped device that has one or two nylon strings hanging down from it.  A hormone IUD contains the hormone progestin (synthetic progesterone). A copper IUD has copper wire wrapped around it.  Synthetic progesterone in a hormone IUD prevents pregnancy by thickening cervical mucus and thinning the walls of the uterus. Copper in a copper IUD prevents pregnancy by making the uterus and fallopian tubes produce a fluid that kills sperm.  A hormone IUD can be left in place for 3-5 years. A copper IUD can be left in place for up to 10 years.  An IUD is inserted and removed by a health care provider. You may feel some pain during insertion and removal. Your health care provider may recommend taking over-the-counter pain medicine, such as ibuprofen, before an IUD procedure. This information is not intended to replace advice given to you by your health care provider. Make sure you discuss any questions you have with your health care provider. Document Revised: 10/26/2017 Document Reviewed: 12/12/2016 Elsevier Patient Education  2020 [employer].

## 2020-03-23 LAB — COMPREHENSIVE METABOLIC PANEL
ALT: 10 IU/L (ref 0–32)
AST: 19 IU/L (ref 0–40)
Albumin/Globulin Ratio: 2 (ref 1.2–2.2)
Albumin: 4.7 g/dL (ref 3.9–5.0)
Alkaline Phosphatase: 52 IU/L (ref 39–117)
BUN/Creatinine Ratio: 10 (ref 9–23)
BUN: 9 mg/dL (ref 6–20)
Bilirubin Total: 1.8 mg/dL — ABNORMAL HIGH (ref 0.0–1.2)
CO2: 20 mmol/L (ref 20–29)
Calcium: 9.5 mg/dL (ref 8.7–10.2)
Chloride: 102 mmol/L (ref 96–106)
Creatinine, Ser: 0.91 mg/dL (ref 0.57–1.00)
GFR calc Af Amer: 104 mL/min/{1.73_m2} (ref >59)
GFR calc non Af Amer: 90 mL/min/{1.73_m2} (ref >59)
Globulin, Total: 2.3 g/dL (ref 1.5–4.5)
Glucose: 81 mg/dL (ref 65–99)
Potassium: 4.3 mmol/L (ref 3.5–5.2)
Sodium: 138 mmol/L (ref 134–144)
Total Protein: 7 g/dL (ref 6.0–8.5)

## 2020-03-23 LAB — CBC WITH DIFFERENTIAL/PLATELET
Basophils Absolute: 0 10*3/uL (ref 0.0–0.2)
Basos: 1 %
EOS (ABSOLUTE): 0.1 10*3/uL (ref 0.0–0.4)
Eos: 1 %
Hematocrit: 40.7 % (ref 34.0–46.6)
Hemoglobin: 13.9 g/dL (ref 11.1–15.9)
Immature Grans (Abs): 0 10*3/uL (ref 0.0–0.1)
Immature Granulocytes: 0 %
Lymphocytes Absolute: 1.7 10*3/uL (ref 0.7–3.1)
Lymphs: 24 %
MCH: 31.4 pg (ref 26.6–33.0)
MCHC: 34.2 g/dL (ref 31.5–35.7)
MCV: 92 fL (ref 79–97)
Monocytes Absolute: 0.6 10*3/uL (ref 0.1–0.9)
Monocytes: 9 %
Neutrophils Absolute: 4.7 10*3/uL (ref 1.4–7.0)
Neutrophils: 65 %
Platelets: 412 10*3/uL (ref 150–450)
RBC: 4.42 x10E6/uL (ref 3.77–5.28)
RDW: 12.5 % (ref 11.7–15.4)
WBC: 7.1 10*3/uL (ref 3.4–10.8)

## 2020-03-23 LAB — LIPID PANEL W/O CHOL/HDL RATIO
Cholesterol, Total: 109 mg/dL (ref 100–199)
HDL: 48 mg/dL (ref >39)
LDL Chol Calc (NIH): 51 mg/dL (ref 0–99)
Triglycerides: 37 mg/dL (ref 0–149)
VLDL Cholesterol Cal: 10 mg/dL (ref 5–40)

## 2020-03-23 LAB — VITAMIN D 25 HYDROXY (VIT D DEFICIENCY, FRACTURES): Vit D, 25-Hydroxy: 46 ng/mL (ref 30.0–100.0)

## 2020-03-23 LAB — TSH: TSH: 1.47 u[IU]/mL (ref 0.450–4.500)

## 2020-03-24 ENCOUNTER — Telehealth: Payer: Self-pay

## 2020-03-24 LAB — URINE CULTURE

## 2020-03-24 MED ORDER — AMOXICILLIN-POT CLAVULANATE 875-125 MG PO TABS: 1.0000 | ORAL_TABLET | Freq: Two times a day (BID) | ORAL | 0 refills | Status: AC

## 2020-03-24 NOTE — Progress Notes (Signed)
Please let patient know that urine culture is positive for urinary tract infection with a large amount of E-coli bacteria. Recommend antibiotic.  Verify allergies/ I show no known drug allergies.  If no allergies will send in Augmentin 875/125 mg to take po BID for 7 days # 14 tablets. Take with food may cause loose stools or mild nausea.   Return to clinic if any symptoms develop or any new urinary symptoms for recheck urine after completing antibiotic.

## 2020-03-24 NOTE — Telephone Encounter (Signed)
-----   Message from Berniece Pap, FNP sent at 03/24/2020  1:32 PM EDT ----- Please let patient know that urine culture is positive for urinary tract infection with a large amount of E-coli bacteria. Recommend antibiotic.  Verify allergies/ I show no known drug allergies.  If no allergies will send in Augmentin 875/125 mg to take po BID for 7 days # 14 tablets. Take with food may cause loose stools or mild nausea.   Return to clinic if any symptoms develop or any new urinary symptoms for recheck urine after completing antibiotic.

## 2020-03-24 NOTE — Telephone Encounter (Signed)
Patient has been advised and verified allergies. Will send in prescription to [facility]. Renette Butters

## 2020-03-29 ENCOUNTER — Ambulatory Visit (INDEPENDENT_AMBULATORY_CARE_PROVIDER_SITE_OTHER): Payer: 59 | Admitting: Psychology

## 2020-03-29 DIAGNOSIS — F3289 Other specified depressive episodes: Secondary | ICD-10-CM | POA: Diagnosis not present

## 2020-03-29 DIAGNOSIS — F411 Generalized anxiety disorder: Secondary | ICD-10-CM

## 2020-04-05 ENCOUNTER — Ambulatory Visit: Payer: 59 | Admitting: Family Medicine

## 2020-04-05 ENCOUNTER — Other Ambulatory Visit: Payer: Self-pay

## 2020-04-05 ENCOUNTER — Encounter: Payer: Self-pay | Admitting: Family Medicine

## 2020-04-05 ENCOUNTER — Ambulatory Visit (INDEPENDENT_AMBULATORY_CARE_PROVIDER_SITE_OTHER): Payer: 59 | Admitting: Psychology

## 2020-04-05 VITALS — BP 135/88 | HR 100 | Temp 97.5°F | Resp 16 | Wt 101.0 lb

## 2020-04-05 DIAGNOSIS — Z3046 Encounter for surveillance of implantable subdermal contraceptive: Secondary | ICD-10-CM | POA: Diagnosis not present

## 2020-04-05 DIAGNOSIS — F411 Generalized anxiety disorder: Secondary | ICD-10-CM

## 2020-04-05 DIAGNOSIS — Z308 Encounter for other contraceptive management: Secondary | ICD-10-CM

## 2020-04-05 DIAGNOSIS — F3289 Other specified depressive episodes: Secondary | ICD-10-CM | POA: Diagnosis not present

## 2020-04-05 LAB — POCT URINE PREGNANCY: Preg Test, Ur: NEGATIVE

## 2020-04-05 MED ORDER — MEDROXYPROGESTERONE ACETATE 150 MG/ML IM SUSP
150.0000 mg | Freq: Once | INTRAMUSCULAR | Status: AC
  Administered 2020-04-05: 150 mg via INTRAMUSCULAR

## 2020-04-05 NOTE — Progress Notes (Signed)
Established patient visit   Patient: Pamela Farmer   DOB: [birth year]   23 y.o. Female  MRN: [id number] Visit Date: 04/05/2020  I,Sulibeya S Dimas,acting as a scribe for Lavon Paganini, MD.,have documented all relevant documentation on the behalf of Lavon Paganini, MD,as directed by  Lavon Paganini, MD while in the presence of Lavon Paganini, MD.  Today's healthcare provider: Lavon Paganini, MD   Chief Complaint  Patient presents with  . Contraception   Subjective    HPI Patient here today to take Nexplanon. Patient reports implant has been in for about 5 years. Patient reports this is her second implant. Had first one at 24 years of age. Patient request to get Depo injections, patient reports that she wouldn't mind gaining some weight. Patient reports periods are very irregular. She has considered her options and does not think she could take a pill regularly  Patient Active Problem List   Diagnosis Date Noted  . Underweight 03/22/2020  . Vitamin D insufficiency 03/22/2020  . Depression, recurrent ([place]) 03/22/2020  . Menstrual spotting 03/22/2020  . Body mass index (BMI) of 19 or less in adult 03/22/2020  . Possible urinary tract infection 03/22/2020  . Leukocytes in urine 03/22/2020  . Psychophysiological insomnia 07/19/2017  . Syncope, vasovagal 07/19/2017  . Tachycardia 07/19/2017  . Dizziness 09/25/2016  . Chronic pain of both knees 09/25/2016  . Anxiety 11/08/2015  . Screening for blood or protein in urine 11/08/2015  . 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 Smoking status: Never Smoker  . Smokeless tobacco: Never Used  Substance Use Topics  . Alcohol use: Yes  . Drug use: No       Medications: Outpatient Medications Prior to Visit  Medication Sig  . escitalopram (LEXAPRO) 10 MG tablet Take 1 tablet (10 mg total) by mouth daily.  Marland Kitchen etonogestrel (IMPLANON) 68 MG IMPL  implant Inject into the skin.   No facility-administered medications prior to visit.    Review of Systems  Constitutional: Negative.   Respiratory: Negative.   Cardiovascular: Negative.   Neurological: Negative.       Objective    BP 135/88 (BP Location: Left Arm, Patient Position: Sitting, Cuff Size: Normal)   Pulse 100   Temp (!) 97.5 F (36.4 C) (Temporal)   Resp 16   Wt 101 lb (45.8 kg)   BMI 17.34 kg/m  Wt Readings from Last 3 Encounters:  04/05/20 101 lb (45.8 kg)  03/22/20 103 lb 6.4 oz (46.9 kg)  03/19/18 101 lb (45.8 kg) (4 %, Z= -1.73)*   * Growth percentiles are based on CDC (Girls, 2-20 Years) data.      Physical Exam Vitals reviewed.  Constitutional:      General: She is not in acute distress.    Appearance: Normal appearance. She is not diaphoretic.  Pulmonary:     Effort: Pulmonary effort is normal. No respiratory distress.  Neurological:     Mental Status: She is alert and oriented to person, place, and time. Mental status is at baseline.      Results for orders placed or performed in visit on 04/05/20  POCT urine pregnancy  Result Value Ref Range   Preg Test, Ur Negative Negative    Assessment & Plan     1. Encounter for Nexplanon removal  PROCEDURE NOTE: NEXPLANON REMOVAL and REINSERTION  REMOVAL Patient given informed consent and signed copy in the chart for  both procedures. An appropriate time out was been taken Pregnancy test negative L  arm area prepped and draped in the usual sterile fashion. Three cc of 1% lidocaine with epinephrine used for local anesthesia. A small stab incision was made close to the nexplanon with scalpel. Hemostats were used to withdraw the Nexplanon.  Patient tolerated well with minimal bleeding  There were no complications.  Pressure bandage was  applied to decrease bruising. Patient given follow up instructions should she experience redness, swelling at sight or fever in the next 24 hours.   2. Encounter  for other contraceptive management - discussed options for alternative contraception - patient decides to proceed with Depo shots - first given today - discussed possible side effects - POCT urine pregnancy - medroxyPROGESTERone (DEPO-PROVERA) injection 150 mg   Return in about 3 months (around 07/06/2020) for Depo injection.      I, Shirlee Latch, MD, have reviewed all documentation for this visit. The documentation on 04/05/20 for the exam, diagnosis, procedures, and orders are all accurate and complete.   B[place], Marzella Schlein, MD, MPH [facility]

## 2020-04-05 NOTE — Patient Instructions (Signed)
Nexplanon Instructions After Removal  Keep bandage clean and dry for 24 hours  May use ice/Tylenol/Ibuprofen for soreness or pain  If you develop fever, drainage or increased warmth from incision site-contact office immediately   

## 2020-04-23 ENCOUNTER — Other Ambulatory Visit: Payer: Self-pay | Admitting: Adult Health

## 2020-04-23 ENCOUNTER — Encounter: Payer: Self-pay | Admitting: Neurology

## 2020-04-23 ENCOUNTER — Other Ambulatory Visit: Payer: Self-pay

## 2020-04-23 ENCOUNTER — Ambulatory Visit (INDEPENDENT_AMBULATORY_CARE_PROVIDER_SITE_OTHER): Payer: 59 | Admitting: Neurology

## 2020-04-23 VITALS — BP 121/67 | HR 82 | Ht 64.0 in | Wt 103.0 lb

## 2020-04-23 DIAGNOSIS — I498 Other specified cardiac arrhythmias: Secondary | ICD-10-CM

## 2020-04-23 DIAGNOSIS — R55 Syncope and collapse: Secondary | ICD-10-CM | POA: Diagnosis not present

## 2020-04-23 DIAGNOSIS — G90A Postural orthostatic tachycardia syndrome (POTS): Secondary | ICD-10-CM

## 2020-04-23 MED ORDER — FLUDROCORTISONE ACETATE 0.1 MG PO TABS: 0.1000 mg | ORAL_TABLET | Freq: Every day | ORAL | 3 refills | Status: AC

## 2020-04-23 MED ORDER — ESCITALOPRAM OXALATE 10 MG PO TABS: 10.0000 mg | ORAL_TABLET | Freq: Every day | ORAL | 0 refills | Status: DC

## 2020-04-23 NOTE — Telephone Encounter (Signed)
Requested Prescriptions  Pending Prescriptions Disp Refills  . escitalopram (LEXAPRO) 10 MG tablet 30 tablet 0    Sig: Take 1 tablet (10 mg total) by mouth daily.     Psychiatry:  Antidepressants - SSRI Passed - 04/23/2020  1:55 PM      Passed - Completed PHQ-2 or PHQ-9 in the last 360 days.      Passed - Valid encounter within last 6 months    Recent Outpatient Visits          2 weeks ago Encounter for Nexplanon removal   [facility], Marzella Schlein, MD   1 month ago Annual physical exam   [facility] Fl[facility]um, Eula Fried, FNP   2 years ago Vasovagal syncope   [facility], Myrle Sheng, MD   2 years ago Tachycardia   [facility], Myrle Sheng, MD   2 years ago Loss of weight   [facility], Myrle Sheng, MD      Future Appointments            In 1 week Flinchum, Eula Fried, FNP [facility]

## 2020-04-23 NOTE — Progress Notes (Signed)
[facility]  PATIENT: Pamela Farmer DOB: 09-21-1996  REFERRING DOCTOR OR PCP: Laverna Peace, FNP SOURCE: Patient, notes from primary care, notes from [facility] cardiology, laboratory reports, tilt table report  _________________________________   HISTORICAL  CHIEF COMPLAINT:  Chief Complaint  Patient presents with  . New Patient (Initial Visit)    rm 13, due to head injuries pt reports tension headaches 3-4 headache days     HISTORY OF PRESENT ILLNESS:  I had the pleasure of seeing patient, Pamela Farmer, [facility] for neurologic consultation regarding her episodes of syncope  She is a 23 year old woman reporting frequent headaches.  She has had episodes of lightheadedness and has had 30+ episodes of fainting since age 37, more recently over the past year.   Sh has hit her head about 8-10 of the times she lost consciousness.   She has many more spells of lightheadedness, occurring daily.   She notes changes in temperature has triggered some spells.    She is standing at the onset of most spells though a few occurred whil sitting.  If standing she tries to sit or lay when a spell occurs.   Her heart rate jumps when she stands.     She has been diagnosed with POTS in the past and advised to increase salt intake.  She had a tilt table test but did not lose consciousness.   Pulse increased > 120 with NTG.    She was advised to do dietary changes and wear compression stockings.    Besides reviewing the notes from [facility] cardiology, laboratory results from 03/22/2020 were also reviewed.  Vitamin D, lipid panel, TSH, CMP and CBC with differential were normal.  Her brother was recently diagnosed with MS.   Her grandfather had a cerebral aneurysm and a cousin had a brain tumor.     REVIEW OF SYSTEMS: Constitutional: No fevers, chills, sweats, or change in appetite Eyes: No visual changes, double vision, eye pain Ear, nose and throat: No hearing loss, ear pain,  nasal congestion, sore throat Cardiovascular: No chest pain, palpitations Respiratory: No shortness of breath at rest or with exertion.   No wheezes GastrointestinaI: No nausea, vomiting, diarrhea, abdominal pain, fecal incontinence Genitourinary: No dysuria, urinary retention or frequency.  No nocturia. Musculoskeletal: No neck pain, back pain Integumentary: No rash, pruritus, skin lesions Neurological: as above Psychiatric: Mild depression/anxiety better on Lexapro. Endocrine: No palpitations, diaphoresis, change in appetite, change in weigh or increased thirst Hematologic/Lymphatic: No anemia, purpura, petechiae. Allergic/Immunologic: No itchy/runny eyes, nasal congestion, recent allergic reactions, rashes  ALLERGIES: No Known Allergies  HOME MEDICATIONS:  Current Outpatient Medications:  .  escitalopram (LEXAPRO) 10 MG tablet, Take 1 tablet (10 mg total) by mouth daily., Disp: 30 tablet, Rfl: 0 .  medroxyPROGESTERone (DEPO-PROVERA) 150 MG/ML injection, Inject 150 mg into the muscle every 3 (three) months., Disp: , Rfl:   PAST MEDICAL HISTORY: Past Medical History:  Diagnosis Date  . Anxiety   . Cataract   . Depression   . Vasovagal near syncope    Self reported    PAST SURGICAL HISTORY: Past Surgical History:  Procedure Laterality Date  . NO PAST SURGERIES    . WISDOM TOOTH EXTRACTION      FAMILY HISTORY: Family History  Problem Relation Age of Onset  . Bipolar disorder Mother   . Macular degeneration Mother   . Mental illness Mother   . Hypertension Father   . ADD / ADHD Father   .  Hyperlipidemia Father   . Healthy Sister   . Mental illness Sister   . Healthy Brother   . Cancer Maternal Grandmother   . Mental illness Maternal Grandmother   . Cancer Maternal Grandfather   . Hyperlipidemia Paternal Grandmother   . Stroke Paternal Grandfather     SOCIAL HISTORY:  Social History   Socioeconomic History  . Marital status: Single    Spouse name: Not  on file  . Number of children: Not on file  . Years of education: 12th Grade  . Highest education level: Not on file  Occupational History  . Occupation: Full Time Student    Comment: [employer]  Tobacco Use  . Smoking status: Never Smoker  . Smokeless tobacco: Never Used  Substance and Sexual Activity  . Alcohol use: Yes  . Drug use: No  . Sexual activity: Not on file  Other Topics Concern  . Not on file  Social History Narrative  . Not on file   Social Determinants of Health   Financial Resource Strain:   . Difficulty of Paying Living Expenses:   Food Insecurity:   . Worried About Programme researcher, broadcasting/film/video in the Last Year:   . Barista in the Last Year:   Transportation Needs:   . Freight forwarder (Medical):   Marland Kitchen Lack of Transportation (Non-Medical):   Physical Activity:   . Days of Exercise per Week:   . Minutes of Exercise per Session:   Stress:   . Feeling of Stress :   Social Connections:   . Frequency of Communication with Friends and Family:   . Frequency of Social Gatherings with Friends and Family:   . Attends Religious Services:   . Active Member of Clubs or Organizations:   . Attends Banker Meetings:   Marland Kitchen Marital Status:   Intimate Partner Violence:   . Fear of Current or Ex-Partner:   . Emotionally Abused:   Marland Kitchen Physically Abused:   . Sexually Abused:      PHYSICAL EXAM  Vitals:   04/23/20 1106  BP: 121/67  Pulse: 82  Weight: 103 lb (46.7 kg)  Height: 5\' 4"  (1.626 m)   Orthostatic Pulse/BP Supine HR 69   BP 117/71  Sitting  HR 84   BP 125/84 Stand 30s  HR 113  Bp 126/83 Stand 34m  HR 103   BP 133/81  Body mass index is 17.68 kg/m.   General: The patient is well-developed and well-nourished and in no acute distress  HEENT:  Head is [place]/AT.  Sclera are anicteric.    Neck: No carotid bruits are noted.  The neck is nontender.  Cardiovascular: The heart has a regular rate and rhythm with a normal S1 and S2.  There were no murmurs, gallops or rubs.    Skin: Extremities are without rash or  edema.  Musculoskeletal:  Back is nontender  Neurologic Exam  Mental status: The patient is alert and oriented x 3 at the time of the examination. The patient has apparent normal recent and remote memory, with an apparently normal attention span and concentration ability.   Speech is normal.  Cranial nerves: Extraocular movements are full. Facial symmetry is present. There is good facial sensation to soft touch bilaterally.Facial strength is normal.  Trapezius and sternocleidomastoid strength is normal. No dysarthria is noted. . No obvious hearing deficits are noted.  Motor:  Muscle bulk is normal.   Tone is normal. Strength is  5 /  5 in all 4 extremities.   Sensory: Sensory testing is intact to pinprick, soft touch and vibration sensation in all 4 extremities.  Coordination: Cerebellar testing reveals good finger-nose-finger and heel-to-shin bilaterally.  Gait and station: Station is normal.   Gait is normal. Tandem gait is normal. Romberg is negative.   Reflexes: Deep tendon reflexes are symmetric and normal bilaterally.   Plantar responses are flexor.    DIAGNOSTIC DATA (LABS, IMAGING, TESTING) - I reviewed patient records, labs, notes, testing and imaging myself where available.  Lab Results  Component Value Date   WBC 7.1 03/22/2020   HGB 13.9 03/22/2020   HCT 40.7 03/22/2020   MCV 92 03/22/2020   PLT 412 03/22/2020      Component Value Date/Time   NA 138 03/22/2020 1022   K 4.3 03/22/2020 1022   CL 102 03/22/2020 1022   CO2 20 03/22/2020 1022   GLUCOSE 81 03/22/2020 1022   GLUCOSE 74 09/25/2016 1229   BUN 9 03/22/2020 1022   CREATININE 0.91 03/22/2020 1022   CREATININE 0.63 09/25/2016 1229   CALCIUM 9.5 03/22/2020 1022   PROT 7.0 03/22/2020 1022   ALBUMIN 4.7 03/22/2020 1022   AST 19 03/22/2020 1022   ALT 10 03/22/2020 1022   ALKPHOS 52 03/22/2020 1022   BILITOT 1.8 (H)  03/22/2020 1022   GFRNONAA 90 03/22/2020 1022   GFRAA 104 03/22/2020 1022   Lab Results  Component Value Date   CHOL 109 03/22/2020   HDL 48 03/22/2020   LDLCALC 51 03/22/2020   TRIG 37 03/22/2020   Lab Results  Component Value Date   HGBA1C 4.6 09/25/2016   No results found for: [id number] Lab Results  Component Value Date   TSH 1.470 03/22/2020       ASSESSMENT AND PLAN  .POTS (postural orthostatic tachycardia syndrome)  Syncope, unspecified syncope type   In summary, Ms. Grout is a 24 year old woman with frequent episodes of presyncope who is experiencing episodes of syncope about 4 or 5 times a year over the past couple of years.  She has hit her head with some of her falls when syncope occurs.  Today, general and neurologic exam was normal.  However, orthostatic vital signs showed tachycardia upon standing without a drop in blood pressure.  This would be consistent with postural orthostatic tachycardia syndrome (POTS) I discussed with her that there are several medications that may help.  I will have her try fludrocortisone 1/2 to 1 pill a day.  If she does not note a benefit after 5 to 6 weeks she will give [place] a call and I would consider pyridostigmine or ProAmatine as other possible treatment options.  Compression stockings and abdominal binders may also help the symptoms but would be difficult to wear over the summer.  She will return to see me in 3 months or sooner for new or worsening neurologic symptoms.  Thank you for asking to see Ms. Jason Fila for a neurologic consultation.  Please let me know if I can be of further assistance with her or other patients in the future.   Mary [place] A. Epimenio Foot, MD, [facility] 04/23/2020, 11:12 AM Certified in Neurology, Clinical Neurophysiology, Sleep Medicine and Neuroimaging  [facility] [address] [place], [place] [address] [phone]

## 2020-04-23 NOTE — Telephone Encounter (Signed)
Copied from CRM [id number]. Topic: Quick Communication - Rx Refill/Question >> Apr 23, 2020  1:50 PM Mcneil, Ja-Kwan wrote: Medication: escitalopram (LEXAPRO) 10 MG tablet  Has the patient contacted their pharmacy? no  Preferred Pharmacy (with phone number or street name): [id number] - [place], [place] - [address]  Phone: [phone]   Fax: [phone]  Agent: Please be advised that RX refills may take up to 3 business days. We ask that you follow-up with your pharmacy.

## 2020-04-26 ENCOUNTER — Ambulatory Visit (INDEPENDENT_AMBULATORY_CARE_PROVIDER_SITE_OTHER): Payer: 59 | Admitting: Psychology

## 2020-04-26 DIAGNOSIS — F411 Generalized anxiety disorder: Secondary | ICD-10-CM | POA: Diagnosis not present

## 2020-04-26 DIAGNOSIS — F3289 Other specified depressive episodes: Secondary | ICD-10-CM

## 2020-05-03 ENCOUNTER — Ambulatory Visit: Payer: Self-pay | Admitting: Adult Health

## 2020-05-03 ENCOUNTER — Ambulatory Visit (INDEPENDENT_AMBULATORY_CARE_PROVIDER_SITE_OTHER): Payer: 59 | Admitting: Psychology

## 2020-05-03 DIAGNOSIS — F3289 Other specified depressive episodes: Secondary | ICD-10-CM | POA: Diagnosis not present

## 2020-05-03 DIAGNOSIS — F411 Generalized anxiety disorder: Secondary | ICD-10-CM

## 2020-05-13 ENCOUNTER — Encounter: Payer: Self-pay | Admitting: Adult Health

## 2020-05-13 ENCOUNTER — Other Ambulatory Visit: Payer: Self-pay

## 2020-05-13 ENCOUNTER — Other Ambulatory Visit (HOSPITAL_COMMUNITY)
Admission: RE | Admit: 2020-05-13 | Discharge: 2020-05-13 | Disposition: A | Discharge status: Home / Self Care | Payer: 59 | Source: Ambulatory Visit | Attending: Adult Health | Admitting: Adult Health

## 2020-05-13 ENCOUNTER — Ambulatory Visit (INDEPENDENT_AMBULATORY_CARE_PROVIDER_SITE_OTHER): Payer: 59 | Admitting: Adult Health

## 2020-05-13 VITALS — BP 100/60 | HR 100 | Temp 97.1°F | Resp 16 | Wt 104.0 lb

## 2020-05-13 DIAGNOSIS — Z0000 Encounter for general adult medical examination without abnormal findings: Secondary | ICD-10-CM | POA: Insufficient documentation

## 2020-05-13 DIAGNOSIS — F321 Major depressive disorder, single episode, moderate: Secondary | ICD-10-CM | POA: Insufficient documentation

## 2020-05-13 DIAGNOSIS — Z124 Encounter for screening for malignant neoplasm of cervix: Secondary | ICD-10-CM | POA: Insufficient documentation

## 2020-05-13 DIAGNOSIS — I498 Other specified cardiac arrhythmias: Secondary | ICD-10-CM

## 2020-05-13 DIAGNOSIS — G90A Postural orthostatic tachycardia syndrome (POTS): Secondary | ICD-10-CM

## 2020-05-13 DIAGNOSIS — N949 Unspecified condition associated with female genital organs and menstrual cycle: Secondary | ICD-10-CM | POA: Insufficient documentation

## 2020-05-13 MED ORDER — ESCITALOPRAM OXALATE 5 MG PO TABS: 5.0000 mg | ORAL_TABLET | Freq: Every day | ORAL | 1 refills | Status: DC

## 2020-05-13 MED ORDER — ESCITALOPRAM OXALATE 10 MG PO TABS: 10.0000 mg | ORAL_TABLET | Freq: Every day | ORAL | 1 refills | Status: DC

## 2020-05-13 NOTE — Patient Instructions (Signed)

## 2020-05-13 NOTE — Progress Notes (Signed)
Established patient visit   Patient: Pamela Farmer   DOB: [birth year]   24 y.o. Female  MRN: [id number] Visit Date: 05/13/2020  Today's healthcare provider: Jairo Ben, FNP   Chief Complaint  Patient presents with  . Depression  . Anxiety   Subjective    HPI Depression, Follow-up  She  was last seen for this 03/22/2020.  Changes made at last visit include restarting Lexapro at 10 mg daily. Patient was advised to follow up in 1 month. She will also have a PAP smear at that time. Will also recheck weight. Keep diary of diet daily.   She reports good compliance with treatment. She is not having side effects.   She reports good tolerance of treatment. Current symptoms include: depressed mood and anxiety She feels she is Improved since last visit.  She has also seen neurology - POTS and sees  Dr. Epimenio Foot. She has some mild tremors in upper extremities at times may be worse with stress she reports.  She has symptoms of Raynaud's.   She has an appointment for with Dr. Graciela Husbands I June 22 electrophysiology. Last syncopal episode was one month ago. Stress and pain usually cause.  Patient  denies any fever, body aches,chills, rash, chest pain, shortness of breath, nausea, vomiting, or diarrhea.   Denies any pain with intercourse or vaginal discharge.  She decided to do Depo. Nexplanon was removed by colleague.   She would like cervical STD testing, declines any blood STD testing.     Depression screen [facility] 2/9 05/13/2020 03/22/2020 07/18/2017  Decreased Interest 1 3 0  Down, Depressed, Hopeless 1 3 0  PHQ - 2 Score 2 6 0  Altered sleeping 2 2 -  Tired, decreased energy 2 3 -  Change in appetite 2 3 -  Feeling bad or failure about yourself  0 2 -  Trouble concentrating 1 3 -  Moving slowly or fidgety/restless 0 3 -  Suicidal thoughts 0 1 -  PHQ-9 Score 9 23 -  Difficult doing work/chores Somewhat difficult Very difficult -      -----------------------------------------------------------------------------------------     Medications: Outpatient Medications Prior to Visit  Medication Sig  . fludrocortisone (FLORINEF) 0.1 MG tablet Take 1 tablet (0.1 mg total) by mouth daily. Take 1/2 to 1 pill daily  . medroxyPROGESTERone (DEPO-PROVERA) 150 MG/ML injection Inject 150 mg into the muscle every 3 (three) months.  . [DISCONTINUED] escitalopram (LEXAPRO) 10 MG tablet Take 1 tablet (10 mg total) by mouth daily.   No facility-administered medications prior to visit.    Review of Systems  Constitutional: Negative for activity change, appetite change, chills, diaphoresis, fatigue, fever and unexpected weight change.  Respiratory: Negative.  Negative for chest tightness and shortness of breath.   Cardiovascular: Negative.  Negative for chest pain and palpitations.  Gastrointestinal: Negative.  Negative for abdominal pain, nausea and vomiting.  Genitourinary: Negative.   Musculoskeletal: Negative.   Skin: Negative.   Neurological: Negative for dizziness and weakness.  Psychiatric/Behavioral: Positive for dysphoric mood. Negative for agitation, behavioral problems and confusion. The patient is nervous/anxious.     Last CBC Lab Results  Component Value Date   WBC 7.1 03/22/2020   HGB 13.9 03/22/2020   HCT 40.7 03/22/2020   MCV 92 03/22/2020   MCH 31.4 03/22/2020   RDW 12.5 03/22/2020   PLT 412 03/22/2020   Last metabolic panel Lab Results  Component Value Date   GLUCOSE 81 03/22/2020   NA 138 03/22/2020  K 4.3 03/22/2020   CL 102 03/22/2020   CO2 20 03/22/2020   BUN 9 03/22/2020   CREATININE 0.91 03/22/2020   GFRNONAA 90 03/22/2020   GFRAA 104 03/22/2020   CALCIUM 9.5 03/22/2020   PROT 7.0 03/22/2020   ALBUMIN 4.7 03/22/2020   LABGLOB 2.3 03/22/2020   AGRATIO 2.0 03/22/2020   BILITOT 1.8 (H) 03/22/2020   ALKPHOS 52 03/22/2020   AST 19 03/22/2020   ALT 10 03/22/2020      Objective    BP  100/60 (BP Location: Right Arm, Patient Position: Sitting, Cuff Size: Normal)   Pulse 100   Temp (!) 97.1 F (36.2 C) (Temporal)   Resp 16   Wt 104 lb (47.2 kg)   SpO2 98% Comment: room air  BMI 17.85 kg/m  BP Readings from Last 3 Encounters:  05/13/20 100/60  04/23/20 121/67  04/05/20 135/88      Physical Exam Exam conducted with a chaperone present.  Constitutional:      General: She is not in acute distress.    Appearance: Normal appearance. She is not ill-appearing, toxic-appearing or diaphoretic.  HENT:     Head: Normocephalic and atraumatic.     Mouth/Throat:     Mouth: Mucous membranes are moist.  Eyes:     Conjunctiva/sclera: Conjunctivae normal.  Cardiovascular:     Rate and Rhythm: Normal rate and regular rhythm.     Pulses: Normal pulses.     Heart sounds: Normal heart sounds. No murmur heard.  No friction rub. No gallop.   Pulmonary:     Effort: Pulmonary effort is normal. No respiratory distress.     Breath sounds: Normal breath sounds. No stridor. No wheezing, rhonchi or rales.  Chest:     Chest wall: No tenderness.  Abdominal:     Palpations: Abdomen is soft.     Hernia: There is no hernia in the left inguinal area or right inguinal area.  Genitourinary:    Exam position: Lithotomy position.     Pubic Area: No rash or pubic lice.      Tanner stage (genital): 5.     Labia:        Right: No rash, tenderness, lesion or injury.        Left: No rash, tenderness, lesion or injury.      Urethra: No prolapse, urethral pain, urethral swelling or urethral lesion.     Vagina: Normal.     Cervix: Normal and dilated.     Uterus: Normal.      Adnexa: Right adnexa normal and left adnexa normal.     Rectum: Normal.  Musculoskeletal:        General: Normal range of motion.     Cervical back: Normal range of motion.  Lymphadenopathy:     Cervical: No cervical adenopathy.     Lower Body: No right inguinal adenopathy. No left inguinal adenopathy.  Skin:     General: Skin is warm and dry.     Capillary Refill: Capillary refill takes less than 2 seconds.  Neurological:     Mental Status: She is alert and oriented to person, place, and time.  Psychiatric:        Mood and Affect: Mood normal.        Behavior: Behavior normal.        Thought Content: Thought content normal.        Judgment: Judgment normal.       No results found for any visits on 05/13/20.  Assessment & Plan     The primary encounter diagnosis was Depression, major, single episode, moderate (HCC). Diagnoses of POTS (postural orthostatic tachycardia syndrome) and Screening for cervical cancer were also pertinent to this visit.  Meds ordered this encounter  Medications  . escitalopram (LEXAPRO) 10 MG tablet    Sig: Take 1 tablet (10 mg total) by mouth daily.    Dispense:  90 tablet    Refill:  1  . escitalopram (LEXAPRO) 5 MG tablet    Sig: Take 1 tablet (5 mg total) by mouth daily.    Dispense:  90 tablet    Refill:  1   increasing to Lexapro 15mg  po qd.    Keep appointment  with neurology for follow up and evaluation with electrophysiologist Dr. .   Return in about 1 month (around 06/12/2020), or if symptoms worsen or fail to improve, for at any time for any worsening symptoms, Go to Emergency room/ urgent care if worse.     PAP repeat in 3 years if normal recommended unless clkincally indicated- results pending - pap was today 05/13/2020.  Advised patient call the office or your primary care doctor for an appointment if no improvement within 72 hours or if any symptoms change or worsen at any time  Advised ER or urgent Care if after hours or on weekend. Call 911 for emergency symptoms at any time.Patinet verbalized understanding of all instructions given/reviewed and treatment plan and has no further questions or concerns at this time.     I6/19/2021 Ardra Kuznicki, FNP, have reviewed all documentation for this visit. The documentation on 05/13/20 for the exam,  diagnosis, procedures, and orders are all accurate and complete.   05/15/20, FNP  [facility] [phone] (phone) [phone] (fax)  [facility]

## 2020-05-17 ENCOUNTER — Ambulatory Visit (INDEPENDENT_AMBULATORY_CARE_PROVIDER_SITE_OTHER): Payer: 59 | Admitting: Psychology

## 2020-05-17 DIAGNOSIS — F411 Generalized anxiety disorder: Secondary | ICD-10-CM | POA: Diagnosis not present

## 2020-05-17 DIAGNOSIS — F3289 Other specified depressive episodes: Secondary | ICD-10-CM | POA: Diagnosis not present

## 2020-05-18 ENCOUNTER — Institutional Professional Consult (permissible substitution): Payer: 59 | Admitting: Internal Medicine

## 2020-05-18 LAB — CYTOLOGY - PAP
Chlamydia: NEGATIVE
Comment: NEGATIVE
Comment: NEGATIVE
Comment: NEGATIVE
Comment: NEGATIVE
Comment: NORMAL
Diagnosis: NEGATIVE
HSV1: NEGATIVE
HSV2: NEGATIVE
High risk HPV: NEGATIVE
Neisseria Gonorrhea: NEGATIVE
Trichomonas: NEGATIVE

## 2020-05-24 ENCOUNTER — Ambulatory Visit: Payer: 59 | Admitting: Psychology

## 2020-06-14 ENCOUNTER — Ambulatory Visit (INDEPENDENT_AMBULATORY_CARE_PROVIDER_SITE_OTHER): Payer: 59 | Admitting: Psychology

## 2020-06-14 DIAGNOSIS — F411 Generalized anxiety disorder: Secondary | ICD-10-CM

## 2020-06-14 DIAGNOSIS — F3289 Other specified depressive episodes: Secondary | ICD-10-CM

## 2020-06-22 ENCOUNTER — Ambulatory Visit: Payer: 59 | Admitting: Adult Health

## 2020-06-22 ENCOUNTER — Other Ambulatory Visit: Payer: Self-pay

## 2020-06-23 ENCOUNTER — Ambulatory Visit (INDEPENDENT_AMBULATORY_CARE_PROVIDER_SITE_OTHER)
Admission: RE | Admit: 2020-06-23 | Discharge: 2020-06-23 | Disposition: A | Discharge status: Home / Self Care | Payer: 59 | Source: Ambulatory Visit

## 2020-06-23 DIAGNOSIS — R0981 Nasal congestion: Secondary | ICD-10-CM

## 2020-06-23 DIAGNOSIS — R519 Headache, unspecified: Secondary | ICD-10-CM | POA: Diagnosis not present

## 2020-06-23 DIAGNOSIS — R05 Cough: Secondary | ICD-10-CM

## 2020-06-23 DIAGNOSIS — J3089 Other allergic rhinitis: Secondary | ICD-10-CM

## 2020-06-23 DIAGNOSIS — R059 Cough, unspecified: Secondary | ICD-10-CM

## 2020-06-23 MED ORDER — CETIRIZINE-PSEUDOEPHEDRINE ER 5-120 MG PO TB12
1.0000 | ORAL_TABLET | Freq: Every day | ORAL | 0 refills | Status: DC
Start: 2020-06-23 — End: 2020-09-20

## 2020-06-23 MED ORDER — FLUTICASONE PROPIONATE 50 MCG/ACT NA SUSP
2.0000 | Freq: Every day | NASAL | 2 refills | Status: DC
Start: 2020-06-23 — End: 2020-09-20

## 2020-06-23 NOTE — Discharge Instructions (Signed)
I have sent in flonase and zyrtec D for you to use for symptomatic relief.  If these are not helping you, follow up in person  Follow up with the ER for trouble swallowing, trouble breathing, other concerning symptoms

## 2020-06-23 NOTE — ED Provider Notes (Signed)
[facility] CARE CENTER  Virtual Visit via Video Note:  Pamela Farmer  initiated request for Telemedicine visit with [facility] Urgent Care team. I connected with Pamela Farmer  on 06/23/2020 at 9:02 AM  for a synchronized telemedicine visit using a video enabled HIPPA compliant telemedicine application. I verified that I am speaking with Pamela Farmer  using two identifiers. Marykay Lex, NP  was physically located in a [facility] Urgent care site and Pamela Farmer was located at a different location.   The limitations of evaluation and management by telemedicine as well as the availability of in-person appointments were discussed. Patient was informed that she  may incur a bill ( including co-pay) for this virtual visit encounter. Pamela Farmer  expressed understanding and gave verbal consent to proceed with virtual visit.   [id number] 06/23/20 Arrival Time: 1540  GQ:QPYP THROAT  SUBJECTIVE: History from: patient.  Pamela Farmer is a 24 y.o. female who presents with abrupt onset of PND, cough, sinus pain and pressure, ear pain for the last 4-5 days. Has been taking tylenol for the headache. Denies to sick exposure to strep, flu or mono, or precipitating event. Symptoms are made worse with swallowing, but tolerating liquids and own secretions without difficulty. Denies previous symptoms in the past.     Denies fever, chills, fatigue, SOB, wheezing, chest pain, nausea, rash, changes in bowel or bladder habits.    ROS: As per HPI.  All other pertinent ROS negative.     Past Medical History:  Diagnosis Date  . Anxiety   . Cataract   . Depression   . Vasovagal near syncope    Self reported   Past Surgical History:  Procedure Laterality Date  . NO PAST SURGERIES    . WISDOM TOOTH EXTRACTION     No Known Allergies No current facility-administered medications on file prior to encounter.   Current Outpatient Medications on File Prior to Encounter  Medication Sig Dispense Refill  .  escitalopram (LEXAPRO) 10 MG tablet Take 1 tablet (10 mg total) by mouth daily. 90 tablet 1  . escitalopram (LEXAPRO) 5 MG tablet Take 1 tablet (5 mg total) by mouth daily. 90 tablet 1  . fludrocortisone (FLORINEF) 0.1 MG tablet Take 1 tablet (0.1 mg total) by mouth daily. Take 1/2 to 1 pill daily 90 tablet 3  . medroxyPROGESTERone (DEPO-PROVERA) 150 MG/ML injection Inject 150 mg into the muscle every 3 (three) months.     Social History   Socioeconomic History  . Marital status: Single    Spouse name: Not on file  . Number of children: Not on file  . Years of education: 12th Grade  . Highest education level: Not on file  Occupational History  . Occupation: Full Time Student    Comment: [employer]  Tobacco Use  . Smoking status: Never Smoker  . Smokeless tobacco: Never Used  Substance and Sexual Activity  . Alcohol use: Yes    Comment: rare  . Drug use: No  . Sexual activity: Not on file  Other Topics Concern  . Not on file  Social History Narrative  . Not on file   Social Determinants of Health   Financial Resource Strain:   . Difficulty of Paying Living Expenses:   Food Insecurity:   . Worried About Programme researcher, broadcasting/film/video in the Last Year:   . Barista in the Last Year:   Transportation Needs:   . Lack of Transportation (  Medical):   Marland Kitchen Lack of Transportation (Non-Medical):   Physical Activity:   . Days of Exercise per Week:   . Minutes of Exercise per Session:   Stress:   . Feeling of Stress :   Social Connections:   . Frequency of Communication with Friends and Family:   . Frequency of Social Gatherings with Friends and Family:   . Attends Religious Services:   . Active Member of Clubs or Organizations:   . Attends Banker Meetings:   Marland Kitchen Marital Status:   Intimate Partner Violence:   . Fear of Current or Ex-Partner:   . Emotionally Abused:   Marland Kitchen Physically Abused:   . Sexually Abused:    Family History  Problem Relation Age of Onset    . Bipolar disorder Mother   . Macular degeneration Mother   . Mental illness Mother   . Hypertension Father   . ADD / ADHD Father   . Hyperlipidemia Father   . Healthy Sister   . Mental illness Sister   . Healthy Brother   . Cancer Maternal Grandmother   . Mental illness Maternal Grandmother   . Cancer Maternal Grandfather   . Hyperlipidemia Paternal Grandmother   . Stroke Paternal Grandfather     OBJECTIVE:   There were no vitals filed for this visit.  General appearance: alert; no distress Eyes: EOMI grossly HENT: normocephalic; atraumatic Neck: supple with FROM Lungs: normal respiratory effort; speaking in full sentences without difficulty Extremities: moves extremities without difficulty Skin: No obvious rashes Neurologic: No facial asymmetries Psychological: alert and cooperative; normal mood and affect  ASSESSMENT & PLAN:  1. Allergic rhinitis due to other allergic trigger, unspecified seasonality   2. Nasal congestion   3. Nonintractable headache, unspecified chronicity pattern, unspecified headache type   4. Cough     Meds ordered this encounter  Medications  . cetirizine-pseudoephedrine (ZYRTEC-D) 5-120 MG tablet    Sig: Take 1 tablet by mouth daily.    Dispense:  30 tablet    Refill:  0    Order Specific Question:   Supervising Provider    Answer:   Merrilee Jansky [id number]  . fluticasone (FLONASE) 50 MCG/ACT nasal spray    Sig: Place 2 sprays into both nostrils daily.    Dispense:  9.9 mL    Refill:  2    Order Specific Question:   Supervising Provider    Answer:   Merrilee Jansky [id number]    Get plenty of rest and push fluids Zyrtec D prescribed. Use daily for symptomatic relief Flonase prescribed Take OTC ibuprofen or tylenol as needed for pain Follow up with PCP if symptoms persists Return or go to ER if patient has any new or worsening symptoms such as fever, chills, nausea, vomiting, worsening sore throat, cough, abdominal pain, chest  pain, changes in bowel or bladder habits  I discussed the assessment and treatment plan with the patient. The patient was provided an opportunity to ask questions and all were answered. The patient agreed with the plan and demonstrated an understanding of the instructions.   The patient was advised to call back or seek an in-person evaluation if the symptoms worsen or if the condition fails to improve as anticipated.  I provided 10 minutes of non-face-to-face time during this encounter.  Marykay Lex, NP  06/23/2020 9:02 AM         Moshe Cipro, NP 06/23/20 [phone]

## 2020-06-28 ENCOUNTER — Ambulatory Visit (INDEPENDENT_AMBULATORY_CARE_PROVIDER_SITE_OTHER): Payer: 59 | Admitting: Psychology

## 2020-06-28 DIAGNOSIS — F411 Generalized anxiety disorder: Secondary | ICD-10-CM | POA: Diagnosis not present

## 2020-06-28 DIAGNOSIS — F3289 Other specified depressive episodes: Secondary | ICD-10-CM | POA: Diagnosis not present

## 2020-07-02 ENCOUNTER — Ambulatory Visit: Payer: 59 | Admitting: Adult Health

## 2020-07-02 ENCOUNTER — Other Ambulatory Visit: Payer: Self-pay

## 2020-07-02 ENCOUNTER — Ambulatory Visit (INDEPENDENT_AMBULATORY_CARE_PROVIDER_SITE_OTHER): Payer: 59 | Admitting: Adult Health

## 2020-07-02 DIAGNOSIS — Z308 Encounter for other contraceptive management: Secondary | ICD-10-CM

## 2020-07-02 LAB — POCT URINE PREGNANCY: Preg Test, Ur: NEGATIVE

## 2020-07-02 MED ORDER — MEDROXYPROGESTERONE ACETATE 150 MG/ML IM SUSP
150.0000 mg | Freq: Once | INTRAMUSCULAR | Status: AC
  Administered 2020-07-02: 150 mg via INTRAMUSCULAR

## 2020-07-02 NOTE — Progress Notes (Signed)
Next appt 09/20/2020. Range 10/22-11/05.

## 2020-07-02 NOTE — Progress Notes (Signed)
Negative urine pregnancy

## 2020-07-05 ENCOUNTER — Ambulatory Visit: Payer: 59 | Admitting: Adult Health

## 2020-07-13 ENCOUNTER — Other Ambulatory Visit: Payer: Self-pay

## 2020-07-13 ENCOUNTER — Ambulatory Visit (INDEPENDENT_AMBULATORY_CARE_PROVIDER_SITE_OTHER): Payer: 59 | Admitting: Internal Medicine

## 2020-07-13 ENCOUNTER — Encounter: Payer: Self-pay | Admitting: Internal Medicine

## 2020-07-13 VITALS — BP 98/60 | HR 64 | Ht 67.0 in | Wt 107.0 lb

## 2020-07-13 DIAGNOSIS — G90A Postural orthostatic tachycardia syndrome (POTS): Secondary | ICD-10-CM

## 2020-07-13 DIAGNOSIS — R55 Syncope and collapse: Secondary | ICD-10-CM

## 2020-07-13 DIAGNOSIS — I498 Other specified cardiac arrhythmias: Secondary | ICD-10-CM

## 2020-07-13 NOTE — Progress Notes (Signed)
ELECTROPHYSIOLOGY OFFICE NOTE  Patient ID: Pamela Farmer, MRN: [id number], DOB/AGE: 14-Feb-1996 24 y.o. Admit date: (Not on file) Date of Consult: 07/13/2020  Primary Physician: Berniece Pap, FNP Primary Cardiologist: new     Pamela Farmer is a 24 y.o. female who is being seen today for the evaluation of syncope.    HPI Pamela Farmer is a 24 y.o. female with a history dating back to when she was 8 on a recurrent syncope.  These are associate with a stereotypical prodrome of tunnel vision, ringing in her ears diaphoresis flushing some nausea.  Prodrome is variably long.  Recovery phase is stereotypical with nausea diaphoresis flushing pallor and residual orthostatic intolerance.  Events are triggered with pain, stress, and occasionally with nothing particular.  Most recently it was a prolonged sitting.  She has shower intolerance, orthostatic intolerance, post exercise lightheadedness.  She tolerates the beach surprisingly well but is attentive to hydration.  She has been on birth control for some years.  Menses are hence quiet.  Denies use of alcohol, marijuana or cocaine.  Was seen at [facility] in [facility].  Tilt of the test was normal.  Orthostatics in our chart from 2017 were normal.  She saw Dr. Epimenio Foot with [facility] recently.  He suggested that she had POTS.  Heart rate maximum at stand was 113.  Started on Florinef.  She has been on longstanding Lexapro.  Anxiety, stress.    Has had some issues with eating;  During high school in [place] she had been up to 125+ pounds.  After high school she lost 30 pounds.  Works fulltime doing Building services engineer.         Past Medical History:  Diagnosis Date  . Anxiety   . Cataract   . Depression   . Vasovagal near syncope    Self reported      Surgical History:  Past Surgical History:  Procedure Laterality Date  . NO PAST SURGERIES    . WISDOM TOOTH EXTRACTION       Current Outpatient Medications  Medication Instructions  .  cetirizine-pseudoephedrine (ZYRTEC-D) 5-120 MG tablet 1 tablet, Oral, Daily  . escitalopram (LEXAPRO) 10 mg, Oral, Daily  . escitalopram (LEXAPRO) 5 mg, Oral, Daily  . fludrocortisone (FLORINEF) 0.1 mg, Oral, Daily, Take 1/2 to 1 pill daily  . fluticasone (FLONASE) 50 MCG/ACT nasal spray 2 sprays, Each Nare, Daily  . medroxyPROGESTERone (DEPO-PROVERA) 150 mg, Intramuscular, Every 3 months    Allergies: No Known Allergies  Social History   Socioeconomic History  . Marital status: Single    Spouse name: Not on file  . Number of children: Not on file  . Years of education: 12th Grade  . Highest education level: Not on file  Occupational History  . Occupation: Full Time Student    Comment: [employer]  Tobacco Use  . Smoking status: Never Smoker  . Smokeless tobacco: Never Used  Substance and Sexual Activity  . Alcohol use: Yes    Comment: rare  . Drug use: No  . Sexual activity: Not on file  Other Topics Concern  . Not on file  Social History Narrative  . Not on file   Social Determinants of Health   Financial Resource Strain:   . Difficulty of Paying Living Expenses:   Food Insecurity:   . Worried About Programme researcher, broadcasting/film/video in the Last Year:   . Barista in the Last Year:   [employer]  Needs:   . Lack of Transportation (Medical):   Marland Kitchen Lack of Transportation (Non-Medical):   Physical Activity:   . Days of Exercise per Week:   . Minutes of Exercise per Session:   Stress:   . Feeling of Stress :   Social Connections:   . Frequency of Communication with Friends and Family:   . Frequency of Social Gatherings with Friends and Family:   . Attends Religious Services:   . Active Member of Clubs or Organizations:   . Attends Banker Meetings:   Marland Kitchen Marital Status:   Intimate Partner Violence:   . Fear of Current or Ex-Partner:   . Emotionally Abused:   Marland Kitchen Physically Abused:   . Sexually Abused:      Family History  Problem Relation Age of  Onset  . Bipolar disorder Mother   . Macular degeneration Mother   . Mental illness Mother   . Hypertension Father   . ADD / ADHD Father   . Hyperlipidemia Father   . Healthy Sister   . Mental illness Sister   . Healthy Brother   . Cancer Maternal Grandmother   . Mental illness Maternal Grandmother   . Cancer Maternal Grandfather   . Hyperlipidemia Paternal Grandmother   . Stroke Paternal Grandfather      ROS:  Please see the history of present illness.     All other systems reviewed and negative.    Physical Exam: Blood pressure 98/60, pulse 64, height 5\' 7"  (1.702 m), weight 107 lb (48.5 kg), SpO2 99 %. General: Well developed, well nourished female in no acute distress. Head: Normocephalic, atraumatic, sclera non-icteric, no xanthomas, nares are without discharge. EENT: normal  Lymph Nodes:  none Neck: Negative for carotid bruits. JVD not elevated. Back:without scoliosis kyphosis Lungs: Clear bilaterally to auscultation without wheezes, rales, or rhonchi. Breathing is unlabored. Heart: RRR with S1 S2. No  murmur . No rubs, or gallops appreciated. Abdomen: Soft, non-tender, non-distended with normoactive bowel sounds. No hepatomegaly. No rebound/guarding. No obvious abdominal masses. Msk:  Strength and tone appear normal for age. Extremities: No clubbing or cyanosis. No edema.  Distal pedal pulses are 2+ and equal bilaterally. Skin: Warm and Dry Neuro: Alert and oriented X 3. CN III-XII intact Grossly normal sensory and motor function . Psych:  Responds to questions appropriately with a normal affect.      Labs: Cardiac Enzymes No results for input(s): CKTOTAL, CKMB, TROPONINI in the last 72 hours. CBC Lab Results  Component Value Date   WBC 7.1 03/22/2020   HGB 13.9 03/22/2020   HCT 40.7 03/22/2020   MCV 92 03/22/2020   PLT 412 03/22/2020   PROTIME: No results for input(s): LABPROT, INR in the last 72 hours. Chemistry No results for input(s): NA, K, CL, CO2,  BUN, CREATININE, CALCIUM, PROT, BILITOT, ALKPHOS, ALT, AST, GLUCOSE in the last 168 hours.  Invalid input(s): LABALBU Lipids Lab Results  Component Value Date   CHOL 109 03/22/2020   HDL 48 03/22/2020   LDLCALC 51 03/22/2020   TRIG 37 03/22/2020   BNP No results found for: PROBNP Thyroid Function Tests: No results for input(s): TSH, T4TOTAL, T3FREE, THYROIDAB in the last 72 hours.  Invalid input(s): FREET3 Miscellaneous No results found for: DDIMER  Radiology/Studies:  No results found.  EKG: sinus @ 64 14/09/41   Assessment and Plan: \ Dysautonomia  Anxiety   ?Eating disorder  We discussed extensively the issues of dysautonomia, the physiology of orthstasis and positional stress.  We discussed the role of salt and water repletion, the importance of exercise, often needing to be started in the recumbent position, and the awareness of triggers and the role of ambient heat and dehydration  Discussed the potential concern of interaction between stress and anxiety and autonomic insufficiency and the importance of being proactive in managing the former  I told her as I do think she has POTS.  None of her testing has demonstrated orthostatic tachycardia that qualifies for the diagnosis, the only data set which I aware that was on drug therapy is to days.  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   [phone]     Sherryl Manges

## 2020-07-13 NOTE — Patient Instructions (Signed)
Medication Instructions:  Your physician recommends that you continue on your current medications as directed. Please refer to the Current Medication list given to you today.  *If you need a refill on your cardiac medications before your next appointment, please call your pharmacy*   Lab Work: None ordered.  If you have labs (blood work) drawn today and your tests are completely normal, you will receive your results only by: Marland Kitchen MyChart Message (if you have MyChart) OR . A paper copy in the mail If you have any lab test that is abnormal or we need to change your treatment, we will call you to review the results.   Testing/Procedures: None ordered.    Follow-Up: At [facility], you and your health needs are our priority.  As part of our continuing mission to provide you with exceptional heart care, we have created designated Provider Care Teams.  These Care Teams include your primary Cardiologist (physician) and Advanced Practice Providers (APPs -  Physician Assistants and Nurse Practitioners) who all work together to provide you with the care you need, when you need it.  We recommend signing up for the patient portal called "MyChart".  Sign up information is provided on this After Visit Summary.  MyChart is used to connect with patients for Virtual Visits (Telemedicine).  Patients are able to view lab/test results, encounter notes, upcoming appointments, etc.  Non-urgent messages can be sent to your provider as well.   To learn more about what you can do with MyChart, go to [email or web address].    Your next appointment:  As needed with Dr Graciela Husbands   Other Instructions Compression wear and fluid/salt repletion as discussed by Dr Graciela Husbands. Liquid IV, Therma Tabs or Pedialyte Advance Care

## 2020-07-26 ENCOUNTER — Ambulatory Visit (INDEPENDENT_AMBULATORY_CARE_PROVIDER_SITE_OTHER): Payer: 59 | Admitting: Psychology

## 2020-07-26 DIAGNOSIS — F411 Generalized anxiety disorder: Secondary | ICD-10-CM | POA: Diagnosis not present

## 2020-07-26 DIAGNOSIS — F3289 Other specified depressive episodes: Secondary | ICD-10-CM

## 2020-08-02 ENCOUNTER — Ambulatory Visit (INDEPENDENT_AMBULATORY_CARE_PROVIDER_SITE_OTHER): Payer: 59 | Admitting: Psychology

## 2020-08-02 DIAGNOSIS — F3289 Other specified depressive episodes: Secondary | ICD-10-CM

## 2020-08-02 DIAGNOSIS — F411 Generalized anxiety disorder: Secondary | ICD-10-CM | POA: Diagnosis not present

## 2020-08-10 ENCOUNTER — Institutional Professional Consult (permissible substitution): Payer: 59 | Admitting: Internal Medicine

## 2020-08-16 ENCOUNTER — Ambulatory Visit (INDEPENDENT_AMBULATORY_CARE_PROVIDER_SITE_OTHER): Payer: 59 | Admitting: Psychology

## 2020-08-16 DIAGNOSIS — F411 Generalized anxiety disorder: Secondary | ICD-10-CM

## 2020-08-16 DIAGNOSIS — F3289 Other specified depressive episodes: Secondary | ICD-10-CM | POA: Diagnosis not present

## 2020-08-30 ENCOUNTER — Ambulatory Visit (INDEPENDENT_AMBULATORY_CARE_PROVIDER_SITE_OTHER): Payer: 59 | Admitting: Psychology

## 2020-08-30 DIAGNOSIS — F411 Generalized anxiety disorder: Secondary | ICD-10-CM

## 2020-08-30 DIAGNOSIS — F3289 Other specified depressive episodes: Secondary | ICD-10-CM

## 2020-09-13 ENCOUNTER — Ambulatory Visit: Payer: 59 | Admitting: Adult Health

## 2020-09-14 ENCOUNTER — Ambulatory Visit (INDEPENDENT_AMBULATORY_CARE_PROVIDER_SITE_OTHER): Payer: 59 | Admitting: Psychology

## 2020-09-14 DIAGNOSIS — F411 Generalized anxiety disorder: Secondary | ICD-10-CM | POA: Diagnosis not present

## 2020-09-14 DIAGNOSIS — F3289 Other specified depressive episodes: Secondary | ICD-10-CM | POA: Diagnosis not present

## 2020-09-20 ENCOUNTER — Telehealth: Payer: Self-pay | Admitting: Neurology

## 2020-09-20 ENCOUNTER — Ambulatory Visit (INDEPENDENT_AMBULATORY_CARE_PROVIDER_SITE_OTHER): Payer: 59 | Admitting: Neurology

## 2020-09-20 ENCOUNTER — Encounter: Payer: Self-pay | Admitting: Neurology

## 2020-09-20 ENCOUNTER — Encounter: Payer: Self-pay | Admitting: Adult Health

## 2020-09-20 ENCOUNTER — Other Ambulatory Visit: Payer: Self-pay

## 2020-09-20 ENCOUNTER — Ambulatory Visit (INDEPENDENT_AMBULATORY_CARE_PROVIDER_SITE_OTHER): Payer: 59 | Admitting: Adult Health

## 2020-09-20 VITALS — BP 105/63 | HR 73 | Temp 98.5°F | Resp 16 | Wt 110.0 lb

## 2020-09-20 VITALS — BP 113/72 | HR 74 | Ht 67.0 in | Wt 110.5 lb

## 2020-09-20 DIAGNOSIS — I498 Other specified cardiac arrhythmias: Secondary | ICD-10-CM | POA: Diagnosis not present

## 2020-09-20 DIAGNOSIS — F339 Major depressive disorder, recurrent, unspecified: Secondary | ICD-10-CM | POA: Diagnosis not present

## 2020-09-20 DIAGNOSIS — Z87828 Personal history of other (healed) physical injury and trauma: Secondary | ICD-10-CM

## 2020-09-20 DIAGNOSIS — R55 Syncope and collapse: Secondary | ICD-10-CM | POA: Diagnosis not present

## 2020-09-20 DIAGNOSIS — R21 Rash and other nonspecific skin eruption: Secondary | ICD-10-CM

## 2020-09-20 DIAGNOSIS — R200 Anesthesia of skin: Secondary | ICD-10-CM

## 2020-09-20 DIAGNOSIS — G90A Postural orthostatic tachycardia syndrome (POTS): Secondary | ICD-10-CM

## 2020-09-20 DIAGNOSIS — Z681 Body mass index (BMI) 19 or less, adult: Secondary | ICD-10-CM | POA: Diagnosis not present

## 2020-09-20 DIAGNOSIS — Z3009 Encounter for other general counseling and advice on contraception: Secondary | ICD-10-CM

## 2020-09-20 DIAGNOSIS — Z820 Family history of epilepsy and other diseases of the nervous system: Secondary | ICD-10-CM

## 2020-09-20 MED ORDER — TRIAMCINOLONE ACETONIDE 0.1 % EX CREA: 1.0000 "application " | TOPICAL_CREAM | Freq: Two times a day (BID) | CUTANEOUS | 0 refills | Status: DC

## 2020-09-20 MED ORDER — PREDNISONE 10 MG (21) PO TBPK: ORAL_TABLET | ORAL | 0 refills | Status: DC

## 2020-09-20 NOTE — Patient Instructions (Addendum)
Would try Zyrtec daily after allergy testing.   Prednisolone tablets What is this medicine? PREDNISOLONE (pred NISS oh lone) is a corticosteroid. It is commonly used to treat inflammation of the skin, joints, lungs, and other organs. Common conditions treated include asthma, allergies, and arthritis. It is also used for other conditions, such as blood disorders and diseases of the adrenal glands. This medicine may be used for other purposes; ask your health care provider or pharmacist if you have questions. COMMON BRAND NAME(S): Millipred, Millipred DP, Millipred DP 12-Day, Millipred DP 6 Day, Prednoral What should I tell my health care provider before I take this medicine? They need to know if you have any of these conditions:  Cushing's syndrome  diabetes  glaucoma  heart problems or disease  high blood pressure  infection such as herpes, measles, tuberculosis, or chickenpox  kidney disease  liver disease  mental problems  myasthenia gravis  osteoporosis  seizures  stomach ulcer or intestine disease including colitis and diverticulitis  thyroid problem  an unusual or allergic reaction to lactose, prednisolone, other medicines, foods, dyes, or preservatives  pregnant or trying to get pregnant  breast-feeding How should I use this medicine? Take this medicine by mouth with a glass of water. Follow the directions on the prescription label. Take it with food or milk to avoid stomach upset. If you are taking this medicine once a day, take it in the morning. Do not take more medicine than you are told to take. Do not suddenly stop taking your medicine because you may develop a severe reaction. Your doctor will tell you how much medicine to take. If your doctor wants you to stop the medicine, the dose may be slowly lowered over time to avoid any side effects. Talk to your pediatrician regarding the use of this medicine in children. Special care may be needed. Overdosage: If  you think you have taken too much of this medicine contact a poison control center or emergency room at once. NOTE: This medicine is only for you. Do not share this medicine with others. What if I miss a dose? If you miss a dose, take it as soon as you can. If it is almost time for your next dose, take only that dose. Do not take double or extra doses. What may interact with this medicine? Do not take this medicine with any of the following medications:  metyrapone  mifepristone This medicine may also interact with the following medications:  aminoglutethimide  amphotericin B  aspirin and aspirin-like medicines  barbiturates  certain medicines for diabetes, like glipizide or glyburide  cholestyramine  cholinesterase inhibitors  cyclosporine  digoxin  diuretics  ephedrine  female hormones, like estrogens and birth control pills  isoniazid  ketoconazole  NSAIDS, medicines for pain and inflammation, like ibuprofen or naproxen  phenytoin  rifampin  toxoids  vaccines  warfarin This list may not describe all possible interactions. Give your health care provider a list of all the medicines, herbs, non-prescription drugs, or dietary supplements you use. Also tell them if you smoke, drink alcohol, or use illegal drugs. Some items may interact with your medicine. What should I watch for while using this medicine? Visit your doctor or health care professional for regular checks on your progress. If you are taking this medicine over a prolonged period, carry an identification card with your name and address, the type and dose of your medicine, and your doctor's name and address. This medicine may increase your risk of getting  an infection. Tell your doctor or health care professional if you are around anyone with measles or chickenpox, or if you develop sores or blisters that do not heal properly. If you are going to have surgery, tell your doctor or health care  professional that you have taken this medicine within the last twelve months. Ask your doctor or health care professional about your diet. You may need to lower the amount of salt you eat. This medicine may increase blood sugar. Ask your healthcare provider if changes in diet or medicines are needed if you have diabetes. What side effects may I notice from receiving this medicine? Side effects that you should report to your doctor or health care professional as soon as possible:  allergic reactions like skin rash, itching or hives, swelling of the face, lips, or tongue  changes in emotions or moods  eye pain   signs and symptoms of high blood sugar such as being more thirsty or hungry or having to urinate more than normal. You may also feel very tired or have blurry vision.  signs and symptoms of infection like fever or chills; cough; sore throat; pain or trouble passing urine  slow growth in children (if used for longer periods of time)  swelling of ankles, feet  trouble sleeping  weak bones (if used for longer periods of time) Side effects that usually do not require medical attention (report to your doctor or health care professional if they continue or are bothersome):  nausea  skin problems, acne, thin and shiny skin  upset stomach  weight gain This list may not describe all possible side effects. Call your doctor for medical advice about side effects. You may report side effects to FDA at 1-800-FDA-1088. Where should I keep my medicine? Keep out of the reach of children. Store at room temperature between 15 and 30 degrees C (59 and 86 degrees F). Keep container tightly closed. Throw away any unused medicine after the expiration date. NOTE: This sheet is a summary. It may not cover all possible information. If you have questions about this medicine, talk to your doctor, pharmacist, or health care provider.  2020 Elsevier/Gold Standard (2018-08-15 10:30:56)  Cetirizine  tablets What is this medicine? CETIRIZINE (se TI ra zeen) is an antihistamine. This medicine is used to treat or prevent symptoms of allergies. It is also used to help reduce itchy skin rash and hives. This medicine may be used for other purposes; ask your health care provider or pharmacist if you have questions. COMMON BRAND NAME(S): All Day Allergy, Allergy Relief, Zyrtec, Zyrtec Hives Relief What should I tell my health care provider before I take this medicine? They need to know if you have any of these conditions:  kidney disease  liver disease  an unusual or allergic reaction to cetirizine, hydroxyzine, other medicines, foods, dyes, or preservatives  pregnant or trying to get pregnant  breast-feeding How should I use this medicine? Take this medicine by mouth with a glass of water. Follow the directions on the prescription label. You can take this medicine with food or on an empty stomach. Take your medicine at regular times. Do not take more often than directed. You may need to take this medicine for several days before your symptoms improve. Talk to your pediatrician regarding the use of this medicine in children. Special care may be needed. While this drug may be prescribed for children as young as 73 years of age for selected conditions, precautions do apply. Overdosage: If  you think you have taken too much of this medicine contact a poison control center or emergency room at once. NOTE: This medicine is only for you. Do not share this medicine with others. What if I miss a dose? If you miss a dose, take it as soon as you can. If it is almost time for your next dose, take only that dose. Do not take double or extra doses. What may interact with this medicine?  alcohol  certain medicines for anxiety or sleep  narcotic medicines for pain  other medicines for colds or allergies This list may not describe all possible interactions. Give your health care provider a list of all  the medicines, herbs, non-prescription drugs, or dietary supplements you use. Also tell them if you smoke, drink alcohol, or use illegal drugs. Some items may interact with your medicine. What should I watch for while using this medicine? Visit your doctor or health care professional for regular checks on your health. Tell your doctor if your symptoms do not improve. You may get drowsy or dizzy. Do not drive, use machinery, or do anything that needs mental alertness until you know how this medicine affects you. Do not stand or sit up quickly, especially if you are an older patient. This reduces the risk of dizzy or fainting spells. Your mouth may get dry. Chewing sugarless gum or sucking hard candy, and drinking plenty of water may help. Contact your doctor if the problem does not go away or is severe. What side effects may I notice from receiving this medicine? Side effects that you should report to your doctor or health care professional as soon as possible:  allergic reactions like skin rash, itching or hives, swelling of the face, lips, or tongue  changes in vision or hearing  fast or irregular heartbeat  trouble passing urine or change in the amount of urine Side effects that usually do not require medical attention (report to your doctor or health care professional if they continue or are bothersome):  dizziness  dry mouth  irritability  sore throat  stomach pain  tiredness This list may not describe all possible side effects. Call your doctor for medical advice about side effects. You may report side effects to FDA at 1-800-FDA-1088. Where should I keep my medicine? Keep out of the reach of children. Store at room temperature between 15 and 30 degrees C (59 and 86 degrees F). Throw away any unused medicine after the expiration date. NOTE: This sheet is a summary. It may not cover all possible information. If you have questions about this medicine, talk to your doctor,  pharmacist, or health care provider.  2020 Elsevier/Gold Standard (2014-12-08 13:44:42) Allergies, Adult An allergy means that your body reacts to something that bothers it (allergen). It is not a normal reaction. This can happen from something that you:  Eat.  Breathe in.  Touch. You can have an allergy (be allergic) to:  Outdoor things, like: ? Pollen. ? Grass. ? Weeds.  Indoor things, like: ? Dust. ? Smoke. ? Pet dander.  Foods.  Medicines.  Things that bother your skin, like: ? Detergents. ? Chemicals. ? Latex.  Perfume.  Bugs. An allergy cannot spread from person to person (is not contagious). Follow these instructions at home:         Stay away from things that you know you are allergic to.  If you have allergies to things in the air, wash out your nose each day. Do it with  one of these: ? A salt-water (saline) spray. ? A container (neti pot).  Take over-the-counter and prescription medicines only as told by your doctor.  Keep all follow-up visits as told by your doctor. This is important.  If you are at risk for a very bad allergy reaction (anaphylaxis), keep an auto-injector with you all the time. This is called an epinephrine injection. ? This is pre-measured medicine with a needle. You can put it into your skin by yourself. ? Right after you have a very bad allergy reaction, you or a person with you must give the medicine in less than a few minutes. This is an emergency.  If you have ever had a very bad allergy reaction, wear a medical alert bracelet or necklace. Your very bad allergy should be written on it. Contact a health care provider if:  Your symptoms do not get better with treatment. Get help right away if:  You have symptoms of a very bad allergy reaction. These include: ? A swollen mouth, tongue, or throat. ? Pain or tightness in your chest. ? Trouble breathing. ? Being short of breath. ? Dizziness. ? Fainting. ? Very bad pain  in your belly (abdomen). ? Throwing up (vomiting). ? Watery poop (diarrhea). Summary  An allergy means that your body reacts to something that bothers it (allergen). It is not a normal reaction.  Stay away from things that make your body react.  Take over-the-counter and prescription medicines only as told by your doctor.  If you are at risk for a very bad allergy reaction, carry an auto-injector (epinephrine injection) all the time. Also, wear a medical alert bracelet or necklace so people know about your allergy. This information is not intended to replace advice given to you by your health care provider. Make sure you discuss any questions you have with your health care provider. Document Revised: 03/04/2019 Document Reviewed: 02/26/2017 Elsevier Patient Education  2020 [employer].

## 2020-09-20 NOTE — Progress Notes (Addendum)
Established patient visit   Patient: Pamela Farmer   DOB: December 19, 1995   24 y.o. Female  MRN: [id number] Visit Date: 09/20/2020  Today's healthcare provider: Jairo Ben, FNP   Chief Complaint  Patient presents with  . Depression  . Rash   Subjective            Depression, Follow-up  She  was last seen for this 4 months ago. Changes made at last visit include increased Lexapro to 20mg .   She reports excellent compliance with treatment. She is not having side effects. Psychiatry increased to Lexapro 20 mg qd. Anxiety is improved.    She reports excellent tolerance of treatment. Current symptoms include: insomnia She feels she is Improved since last visit.  Denies any suicidal or homicidal ideations oir intents.    She gets an itchy rash occasionally on arms and feet. Varies pop's up. She felt was due to anxiety. Zyrtec helped with itchiness.  Denies any new exposures. No new products. She would like to be allery testing.  She did throat swelling or itching.   She also would like to stop Depo injections for birth control and " let my body do its own thing" . She  Desires no alternative contraception.  She understands pregnancy chances.  Patient  denies any fever, body aches,chills, rash, chest pain, shortness of breath, nausea, vomiting, or diarrhea.  Denies dizziness, lightheadedness, pre syncopal or syncopal episodes.      Depression screen [facility] 2/9 09/20/2020 05/13/2020 03/22/2020  Decreased Interest 2 1 3   Down, Depressed, Hopeless 0 1 3  PHQ - 2 Score 2 2 6   Altered sleeping 2 2 2   Tired, decreased energy 2 2 3   Change in appetite 3 2 3   Feeling bad or failure about yourself  0 0 2  Trouble concentrating 1 1 3   Moving slowly or fidgety/restless 0 0 3  Suicidal thoughts 0 0 1  PHQ-9 Score 10 9 23   Difficult doing work/chores Not difficult at all Somewhat difficult Very difficult      -----------------------------------------------------------------------------------------     Medications: Outpatient Medications Prior to Visit  Medication Sig  . escitalopram (LEXAPRO) 20 MG tablet Take 20 mg by mouth daily.  . fludrocortisone (FLORINEF) 0.1 MG tablet Take 1 tablet (0.1 mg total) by mouth daily. Take 1/2 to 1 pill daily  . [DISCONTINUED] cetirizine-pseudoephedrine (ZYRTEC-D) 5-120 MG tablet Take 1 tablet by mouth daily. (Patient not taking: Reported on 09/20/2020)  . [DISCONTINUED] escitalopram (LEXAPRO) 10 MG tablet Take 1 tablet (10 mg total) by mouth daily.  . [DISCONTINUED] escitalopram (LEXAPRO) 5 MG tablet Take 1 tablet (5 mg total) by mouth daily.  . [DISCONTINUED] fluticasone (FLONASE) 50 MCG/ACT nasal spray Place 2 sprays into both nostrils daily.  . [DISCONTINUED] medroxyPROGESTERone (DEPO-PROVERA) 150 MG/ML injection Inject 150 mg into the muscle every 3 (three) months.   No facility-administered medications prior to visit.    Review of Systems  Constitutional: Negative.   HENT: Negative.   Eyes: Negative for pain.  Respiratory: Negative.   Cardiovascular: Negative.   Gastrointestinal: Negative.   Genitourinary: Negative.   Musculoskeletal: Negative.   Skin: Positive for rash (history of pruritic rash none today ).  Neurological: Negative.   Hematological: Negative.   Psychiatric/Behavioral: Negative.     Last CBC Lab Results  Component Value Date   WBC 7.1 03/22/2020   HGB 13.9 03/22/2020   HCT 40.7 03/22/2020   MCV 92 03/22/2020   MCH 31.4 03/22/2020  RDW 12.5 03/22/2020   PLT 412 03/22/2020   Last metabolic panel Lab Results  Component Value Date   GLUCOSE 81 03/22/2020   NA 138 03/22/2020   K 4.3 03/22/2020   CL 102 03/22/2020   CO2 20 03/22/2020   BUN 9 03/22/2020   CREATININE 0.91 03/22/2020   GFRNONAA 90 03/22/2020   GFRAA 104 03/22/2020   CALCIUM 9.5 03/22/2020   PROT 7.0 03/22/2020   ALBUMIN 4.7 03/22/2020   LABGLOB  2.3 03/22/2020   AGRATIO 2.0 03/22/2020   BILITOT 1.8 (H) 03/22/2020   ALKPHOS 52 03/22/2020   AST 19 03/22/2020   ALT 10 03/22/2020   Last lipids Lab Results  Component Value Date   CHOL 109 03/22/2020   HDL 48 03/22/2020   LDLCALC 51 03/22/2020   TRIG 37 03/22/2020   Last hemoglobin A1c Lab Results  Component Value Date   HGBA1C 4.6 09/25/2016   Last thyroid functions Lab Results  Component Value Date   TSH 1.470 03/22/2020   Last vitamin D Lab Results  Component Value Date   VD25OH 46.0 03/22/2020   Last vitamin B12 and Folate No results found for: VITAMINB12, FOLATE    Objective    BP 105/63   Pulse 73   Temp 98.5 F (36.9 C) (Oral)   Resp 16   Wt 110 lb (49.9 kg)   LMP  (Within Days)   BMI 17.23 kg/m  BP Readings from Last 3 Encounters:  09/20/20 113/72  09/20/20 105/63  07/13/20 98/60      Physical Exam Constitutional:      General: She is not in acute distress.    Appearance: Normal appearance. She is not ill-appearing, toxic-appearing or diaphoretic.  HENT:     Head: Normocephalic and atraumatic.     Right Ear: External ear normal.     Left Ear: External ear normal.     Nose: Nose normal.     Mouth/Throat:     Mouth: Mucous membranes are moist.     Pharynx: No oropharyngeal exudate or posterior oropharyngeal erythema.  Eyes:     Extraocular Movements: Extraocular movements intact.     Conjunctiva/sclera: Conjunctivae normal.     Pupils: Pupils are equal, round, and reactive to light.  Neck:     Vascular: No carotid bruit.  Cardiovascular:     Rate and Rhythm: Normal rate and regular rhythm.     Pulses: Normal pulses.     Heart sounds: Normal heart sounds. No murmur heard.  No friction rub. No gallop.   Pulmonary:     Effort: Pulmonary effort is normal. No respiratory distress.     Breath sounds: Normal breath sounds. No stridor. No wheezing, rhonchi or rales.  Chest:     Chest wall: No tenderness.  Abdominal:     Palpations:  Abdomen is soft.  Musculoskeletal:        General: Normal range of motion.     Cervical back: Normal range of motion and neck supple. No rigidity or tenderness.  Lymphadenopathy:     Cervical: No cervical adenopathy.  Skin:    General: Skin is warm.     Capillary Refill: Capillary refill takes less than 2 seconds.     Coloration: Skin is not jaundiced or pale.     Findings: No bruising, erythema, lesion or rash.  Neurological:     Mental Status: She is alert and oriented to person, place, and time.  Psychiatric:        Mood  and Affect: Mood normal.        Behavior: Behavior normal.        Thought Content: Thought content normal.        Judgment: Judgment normal.       No results found for any visits on 09/20/20.  Assessment & Plan     .Rash and nonspecific skin eruption - Plan: triamcinolone cream (KENALOG) 0.1 %, Ambulatory referral to Allergy, DISCONTINUED: predniSONE (STERAPRED UNI-PAK 21 TAB) 10 MG (21) TBPK tablet  Depression, recurrent (HCC)  Birth control counseling  Body mass index (BMI) of 19 or less in adult Depo discontinued for birth control, she does not desire alternative.  Orders Placed This Encounter  Procedures  . Ambulatory referral to Allergy   Meds ordered this encounter  Medications  . DISCONTD: predniSONE (STERAPRED UNI-PAK 21 TAB) 10 MG (21) TBPK tablet    Sig: PO: Take 6 tablets on day 1:Take 5 tablets day 2:Take 4 tablets day 3: Take 3 tablets day 4:Take 2 tablets day five: 5 Take 1 tablet day 6    Dispense:  21 tablet    Refill:  0  . triamcinolone cream (KENALOG) 0.1 %    Sig: Apply 1 application topically 2 (two) times daily. Very thin layer to affected to areas only. Not for face or genitals.    Dispense:  45 g    Refill:  0  sent above medication for rash since this was second occurrence, unknown trigger. Denies any anaphylaxis symptoms.    Recommend zyrtec daily per package. She understands will have to be stopped per allergist before  any testing for allergies.   Discontinue Depo injection, birth control counseling performed. She desires no alternative birth control and understands pregnancy may occur.   Red Flags discussed. The patient was given clear instructions to go to ER or return to medical center if any red flags develop, symptoms do not improve, worsen or new problems develop. They verbalized understanding.    Return in about 6 months (around 03/21/2021), or if symptoms worsen or fail to improve, for at any time for any worsening symptoms, Go to Emergency room/ urgent care if worse.         Jairo Ben, FNP  [facility] [phone] (phone) [phone] (fax)  [facility]

## 2020-09-20 NOTE — Progress Notes (Signed)
[facility]  PATIENT: Pamela Farmer DOB: 1996/03/14  REFERRING DOCTOR OR PCP: Marvell FullerMichelle Flinchum, FNP SOURCE: Patient, notes from primary care, [facility], laboratory reports, tilt table report  _________________________________   HISTORICAL  CHIEF COMPLAINT:  Chief Complaint  Patient presents with  . Follow-up    RM 12, alone.  Last seen 04/23/2020. here to f/u on POTS, takes fludrocortisone. Feels it helps some. Sx still present but not as severe.     HISTORY OF PRESENT ILLNESS:  Pamela Farmer is a 24 year old woman reporting frequent headaches and syncopal spells.  Update 09/20/2020 At the last visit, fludrocortisone was started.   She notes a mild improvement since starting. The lightheaded spells are better and she has not had any episodes of syncope.  She had 3 syncopal spells in 2021 and 6 or so in 2020.   She often will get a HA after standing that improves by itself over the next few hours, quicker if she is resting.   Longest HA lasted 3 days.   Pain is pinprick quality, mostly in the temples and behind her eyes.   She denies N/V.   She has photophobia.    She has numbness in her arms lasting hours.   Not associated with standing or other activities.     Her brother was just diagnosed with MS (age 22).  His first symptoms were ataxia  SYNCOPE History: She has had episodes of lightheadedness and has had 30+ episodes of fainting since age 89, more recently over the past year.   Sh has hit her head about 8-10 of the times she lost consciousness.   She has many more spells of lightheadedness, occurring daily.   She notes changes in temperature has triggered some spells.    She is standing at the onset of most spells though a few occurred whil sitting.  If standing she tries to sit or lay when a spell occurs.   Her heart rate jumps when she stands.     She has been diagnosed with POTS in the past and advised to increase salt intake.  She had a tilt  table test but did not lose consciousness.   Pulse increased > 120 with NTG.    She was advised to do dietary changes and wear compression stockings.    Besides [facility], laboratory results from 03/22/2020 were also reviewed.  Vitamin D, lipid panel, TSH, CMP and CBC with differential were normal.   REVIEW OF SYSTEMS: Constitutional: No fevers, chills, sweats, or change in appetite Eyes: No visual changes, double vision, eye pain Ear, nose and throat: No hearing loss, ear pain, nasal congestion, sore throat Cardiovascular: No chest pain, palpitations Respiratory: No shortness of breath at rest or with exertion.   No wheezes GastrointestinaI: No nausea, vomiting, diarrhea, abdominal pain, fecal incontinence Genitourinary: No dysuria, urinary retention or frequency.  No nocturia. Musculoskeletal: No neck pain, back pain Integumentary: No rash, pruritus, skin lesions Neurological: as above Psychiatric: Mild depression/anxiety better on Lexapro. Endocrine: No palpitations, diaphoresis, change in appetite, change in weigh or increased thirst Hematologic/Lymphatic: No anemia, purpura, petechiae. Allergic/Immunologic: No itchy/runny eyes, nasal congestion, recent allergic reactions, rashes  ALLERGIES: No Known Allergies  HOME MEDICATIONS:  Current Outpatient Medications:  .  escitalopram (LEXAPRO) 20 MG tablet, Take 20 mg by mouth daily., Disp: , Rfl:  .  fludrocortisone (FLORINEF) 0.1 MG tablet, Take 1 tablet (0.1 mg total) by mouth daily. Take 1/2  to 1 pill daily, Disp: 90 tablet, Rfl: 3 .  triamcinolone cream (KENALOG) 0.1 %, Apply 1 application topically 2 (two) times daily. Very thin layer to affected to areas only. Not for face or genitals. (Patient taking differently: Apply 1 application topically 2 (two) times daily. Very thin layer to affected to areas only. Not for face or genitals. AS NEEDED), Disp: 45 g, Rfl: 0  PAST MEDICAL HISTORY: Past Medical  History:  Diagnosis Date  . Anxiety   . Cataract   . Depression   . Vasovagal near syncope    Self reported    PAST SURGICAL HISTORY: Past Surgical History:  Procedure Laterality Date  . NO PAST SURGERIES    . WISDOM TOOTH EXTRACTION      FAMILY HISTORY: Family History  Problem Relation Age of Onset  . Bipolar disorder Mother   . Macular degeneration Mother   . Mental illness Mother   . Hypertension Father   . ADD / ADHD Father   . Hyperlipidemia Father   . Healthy Sister   . Mental illness Sister   . Healthy Brother   . Cancer Maternal Grandmother   . Mental illness Maternal Grandmother   . Cancer Maternal Grandfather   . Hyperlipidemia Paternal Grandmother   . Stroke Paternal Grandfather     SOCIAL HISTORY:  Social History   Socioeconomic History  . Marital status: Single    Spouse name: Not on file  . Number of children: Not on file  . Years of education: 12th Grade  . Highest education level: Not on file  Occupational History  . Occupation: Full Time Student    Comment: [employer]  Tobacco Use  . Smoking status: Never Smoker  . Smokeless tobacco: Never Used  Substance and Sexual Activity  . Alcohol use: Yes    Comment: rare  . Drug use: No  . Sexual activity: Not on file  Other Topics Concern  . Not on file  Social History Narrative  . Not on file   Social Determinants of Health   Financial Resource Strain:   . Difficulty of Paying Living Expenses: Not on file  Food Insecurity:   . Worried About Programme researcher, broadcasting/film/video in the Last Year: Not on file  . Ran Out of Food in the Last Year: Not on file  Transportation Needs:   . Lack of Transportation (Medical): Not on file  . Lack of Transportation (Non-Medical): Not on file  Physical Activity:   . Days of Exercise per Week: Not on file  . Minutes of Exercise per Session: Not on file  Stress:   . Feeling of Stress : Not on file  Social Connections:   . Frequency of Communication with  Friends and Family: Not on file  . Frequency of Social Gatherings with Friends and Family: Not on file  . Attends Religious Services: Not on file  . Active Member of Clubs or Organizations: Not on file  . Attends Banker Meetings: Not on file  . Marital Status: Not on file  Intimate Partner Violence:   . Fear of Current or Ex-Partner: Not on file  . Emotionally Abused: Not on file  . Physically Abused: Not on file  . Sexually Abused: Not on file     PHYSICAL EXAM  Vitals:   09/20/20 1131  BP: 113/72  Pulse: 74  Weight: 110 lb 8 oz (50.1 kg)  Height: 5\' 7"  (1.702 m)   Orthostatic Pulse/BP Supine HR 69  BP 117/71  Sitting  HR 84   BP 125/84 Stand 30s  HR 113  Bp 126/83 Stand 68m  HR 103   BP 133/81  Body mass index is 17.31 kg/m.   General: The patient is well-developed and well-nourished and in no acute distress  HEENT:  Head is [place]/AT.  Sclera are anicteric.    Neck: No carotid bruits are noted.  The neck is nontender.  Cardiovascular: The heart has a regular rate and rhythm with a normal S1 and S2. There were no murmurs, gallops or rubs.    Skin: Extremities are without rash or  edema.  Musculoskeletal:  Back is nontender  Neurologic Exam  Mental status: The patient is alert and oriented x 3 at the time of the examination. The patient has apparent normal recent and remote memory, with an apparently normal attention span and concentration ability.   Speech is normal.  Cranial nerves: Extraocular movements are full. Facial symmetry is present. There is good facial sensation to soft touch bilaterally.Facial strength is normal.  Trapezius and sternocleidomastoid strength is normal. No dysarthria is noted. . No obvious hearing deficits are noted.  Motor:  Muscle bulk is normal.   Tone is normal. Strength is  5 / 5 in all 4 extremities.   Sensory: Sensory testing is intact to pinprick, soft touch and vibration sensation in all 4  extremities.  Coordination: Cerebellar testing reveals good finger-nose-finger and heel-to-shin bilaterally.  Gait and station: Station is normal.   Gait is normal. Tandem gait is normal. Romberg is negative.   Reflexes: Deep tendon reflexes are symmetric and normal bilaterally.   Plantar responses are flexor.    DIAGNOSTIC DATA (LABS, IMAGING, TESTING) - I reviewed patient records, labs, notes, testing and imaging myself where available.  Lab Results  Component Value Date   WBC 7.1 03/22/2020   HGB 13.9 03/22/2020   HCT 40.7 03/22/2020   MCV 92 03/22/2020   PLT 412 03/22/2020      Component Value Date/Time   NA 138 03/22/2020 1022   K 4.3 03/22/2020 1022   CL 102 03/22/2020 1022   CO2 20 03/22/2020 1022   GLUCOSE 81 03/22/2020 1022   GLUCOSE 74 09/25/2016 1229   BUN 9 03/22/2020 1022   CREATININE 0.91 03/22/2020 1022   CREATININE 0.63 09/25/2016 1229   CALCIUM 9.5 03/22/2020 1022   PROT 7.0 03/22/2020 1022   ALBUMIN 4.7 03/22/2020 1022   AST 19 03/22/2020 1022   ALT 10 03/22/2020 1022   ALKPHOS 52 03/22/2020 1022   BILITOT 1.8 (H) 03/22/2020 1022   GFRNONAA 90 03/22/2020 1022   GFRAA 104 03/22/2020 1022   Lab Results  Component Value Date   CHOL 109 03/22/2020   HDL 48 03/22/2020   LDLCALC 51 03/22/2020   TRIG 37 03/22/2020   Lab Results  Component Value Date   HGBA1C 4.6 09/25/2016   No results found for: [id number] Lab Results  Component Value Date   TSH 1.470 03/22/2020       ASSESSMENT AND PLAN  .No diagnosis found.  In summary, Ms. Larabee is a 24 year old woman with frequent episodes of presyncope who is experiencing episodes of syncope about 4 or 5 times a year over the past couple of years.  She has hit her head with some of her falls when syncope occurs.  Today, general and neurologic exam was normal.  However, orthostatic vital signs showed tachycardia upon standing without a drop in blood pressure.  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  I will have her try fludrocortisone 1/2 to 1 pill a day.  If she does not note a benefit after 5 to 6 weeks she will give [place] a call and I would consider pyridostigmine or ProAmatine as other possible treatment options.  Compression stockings and abdominal binders may also help the symptoms but would be difficult to wear over the summer.  She will return to see me in 3 months or sooner for new or worsening neurologic symptoms.  Thank you for asking to see Ms. Jason Fila for a neurologic consultation.  Please let me know if I can be of further assistance with her or other patients in the future.   Nocole Zammit A. Epimenio Foot, MD, [facility] 09/20/2020, 11:59 AM Certified in Neurology, Clinical Neurophysiology, Sleep Medicine and Neuroimaging  [facility] [address], [address] [place], [place] [address] [phone]

## 2020-09-20 NOTE — Telephone Encounter (Signed)
[facility] pending

## 2020-09-21 NOTE — Telephone Encounter (Signed)
no to the covid questions MR Brain w/wo contrast Dr. Valda [facility] Berkley Harvey: [phone] (exp. 09/20/20 to 12/19/20). Patient is scheduled at [facility] for 09/28/20.

## 2020-09-23 NOTE — Addendum Note (Signed)
Addended by: Berniece Pap on: 09/23/2020 03:22 PM   Modules accepted: Kipp Brood

## 2020-09-28 ENCOUNTER — Other Ambulatory Visit: Payer: 59

## 2020-09-28 ENCOUNTER — Ambulatory Visit: Payer: 59

## 2020-09-28 DIAGNOSIS — Z820 Family history of epilepsy and other diseases of the nervous system: Secondary | ICD-10-CM

## 2020-09-28 DIAGNOSIS — R55 Syncope and collapse: Secondary | ICD-10-CM

## 2020-09-28 DIAGNOSIS — Z87828 Personal history of other (healed) physical injury and trauma: Secondary | ICD-10-CM

## 2020-09-28 DIAGNOSIS — R200 Anesthesia of skin: Secondary | ICD-10-CM

## 2020-09-28 MED ORDER — GADOBENATE DIMEGLUMINE 529 MG/ML IV SOLN
10.0000 mL | Freq: Once | INTRAVENOUS | Status: AC | PRN
  Administered 2020-09-28: 10 mL via INTRAVENOUS

## 2020-10-01 ENCOUNTER — Ambulatory Visit: Payer: 59 | Admitting: Psychology

## 2020-10-28 ENCOUNTER — Ambulatory Visit (INDEPENDENT_AMBULATORY_CARE_PROVIDER_SITE_OTHER): Payer: 59 | Admitting: Psychology

## 2020-10-28 DIAGNOSIS — F411 Generalized anxiety disorder: Secondary | ICD-10-CM

## 2020-10-28 DIAGNOSIS — F3289 Other specified depressive episodes: Secondary | ICD-10-CM

## 2020-11-08 ENCOUNTER — Ambulatory Visit (INDEPENDENT_AMBULATORY_CARE_PROVIDER_SITE_OTHER): Payer: BC Managed Care – PPO | Admitting: Psychology

## 2020-11-08 DIAGNOSIS — F3289 Other specified depressive episodes: Secondary | ICD-10-CM | POA: Diagnosis not present

## 2020-11-08 DIAGNOSIS — F411 Generalized anxiety disorder: Secondary | ICD-10-CM

## 2020-11-16 ENCOUNTER — Ambulatory Visit: Payer: Self-pay | Admitting: Psychology

## 2020-11-29 ENCOUNTER — Ambulatory Visit (INDEPENDENT_AMBULATORY_CARE_PROVIDER_SITE_OTHER): Payer: BC Managed Care – PPO | Admitting: Psychology

## 2020-11-29 DIAGNOSIS — F3289 Other specified depressive episodes: Secondary | ICD-10-CM

## 2020-11-29 DIAGNOSIS — F411 Generalized anxiety disorder: Secondary | ICD-10-CM

## 2020-12-10 ENCOUNTER — Ambulatory Visit (INDEPENDENT_AMBULATORY_CARE_PROVIDER_SITE_OTHER): Payer: BC Managed Care – PPO | Admitting: Psychology

## 2020-12-10 DIAGNOSIS — F3289 Other specified depressive episodes: Secondary | ICD-10-CM

## 2020-12-10 DIAGNOSIS — F411 Generalized anxiety disorder: Secondary | ICD-10-CM | POA: Diagnosis not present

## 2020-12-15 ENCOUNTER — Ambulatory Visit (INDEPENDENT_AMBULATORY_CARE_PROVIDER_SITE_OTHER): Payer: BC Managed Care – PPO | Admitting: Psychology

## 2020-12-15 DIAGNOSIS — F3289 Other specified depressive episodes: Secondary | ICD-10-CM

## 2020-12-15 DIAGNOSIS — F411 Generalized anxiety disorder: Secondary | ICD-10-CM | POA: Diagnosis not present

## 2020-12-29 ENCOUNTER — Ambulatory Visit: Payer: Self-pay | Admitting: Psychology

## 2021-01-07 ENCOUNTER — Ambulatory Visit (INDEPENDENT_AMBULATORY_CARE_PROVIDER_SITE_OTHER): Payer: BC Managed Care – PPO | Admitting: Psychology

## 2021-01-07 DIAGNOSIS — F411 Generalized anxiety disorder: Secondary | ICD-10-CM

## 2021-01-07 DIAGNOSIS — F3289 Other specified depressive episodes: Secondary | ICD-10-CM

## 2021-01-11 ENCOUNTER — Ambulatory Visit (INDEPENDENT_AMBULATORY_CARE_PROVIDER_SITE_OTHER): Payer: BC Managed Care – PPO | Admitting: Psychology

## 2021-01-11 DIAGNOSIS — F3289 Other specified depressive episodes: Secondary | ICD-10-CM | POA: Diagnosis not present

## 2021-01-11 DIAGNOSIS — F411 Generalized anxiety disorder: Secondary | ICD-10-CM | POA: Diagnosis not present

## 2021-01-26 ENCOUNTER — Ambulatory Visit (INDEPENDENT_AMBULATORY_CARE_PROVIDER_SITE_OTHER): Payer: BC Managed Care – PPO | Admitting: Psychology

## 2021-01-26 DIAGNOSIS — F411 Generalized anxiety disorder: Secondary | ICD-10-CM

## 2021-01-26 DIAGNOSIS — F3289 Other specified depressive episodes: Secondary | ICD-10-CM

## 2021-02-01 ENCOUNTER — Ambulatory Visit (INDEPENDENT_AMBULATORY_CARE_PROVIDER_SITE_OTHER): Payer: BC Managed Care – PPO | Admitting: Psychology

## 2021-02-01 DIAGNOSIS — F411 Generalized anxiety disorder: Secondary | ICD-10-CM | POA: Diagnosis not present

## 2021-02-01 DIAGNOSIS — F3289 Other specified depressive episodes: Secondary | ICD-10-CM

## 2021-02-08 ENCOUNTER — Ambulatory Visit (INDEPENDENT_AMBULATORY_CARE_PROVIDER_SITE_OTHER): Payer: BC Managed Care – PPO | Admitting: Psychology

## 2021-02-08 DIAGNOSIS — F3289 Other specified depressive episodes: Secondary | ICD-10-CM

## 2021-02-08 DIAGNOSIS — F411 Generalized anxiety disorder: Secondary | ICD-10-CM | POA: Diagnosis not present

## 2021-02-14 ENCOUNTER — Ambulatory Visit (INDEPENDENT_AMBULATORY_CARE_PROVIDER_SITE_OTHER): Payer: BC Managed Care – PPO | Admitting: Psychology

## 2021-02-14 DIAGNOSIS — F3289 Other specified depressive episodes: Secondary | ICD-10-CM | POA: Diagnosis not present

## 2021-02-14 DIAGNOSIS — F411 Generalized anxiety disorder: Secondary | ICD-10-CM | POA: Diagnosis not present

## 2021-02-16 ENCOUNTER — Other Ambulatory Visit: Payer: Self-pay | Admitting: Adult Health

## 2021-02-16 MED ORDER — ESCITALOPRAM OXALATE 20 MG PO TABS: 20.0000 mg | ORAL_TABLET | Freq: Every day | ORAL | 0 refills | Status: DC

## 2021-02-16 NOTE — Telephone Encounter (Signed)
Requested medication (s) are due for refill today - unknown  Requested medication (s) are on the active medication list -yes  Future visit scheduled -no  Last refill: 07/22/20  Notes to clinic: Request for medication listed as historical   Requested Prescriptions  Pending Prescriptions Disp Refills   escitalopram (LEXAPRO) 20 MG tablet      Sig: Take 1 tablet (20 mg total) by mouth daily.      Psychiatry:  Antidepressants - SSRI Passed - 02/16/2021  4:18 PM      Passed - Completed PHQ-2 or PHQ-9 in the last 360 days      Passed - Valid encounter within last 6 months    Recent Outpatient Visits           4 months ago Rash and nonspecific skin eruption   [facility] Flinchum, Eula Fried, FNP   9 months ago Depression, major, single episode, moderate (HCC)   [facility] Flinchum, Eula Fried, FNP   10 months ago Encounter for [employer] removal   [facility], Marzella Schlein, MD   11 months ago Annual physical exam   [facility], Eula Fried, FNP   2 years ago Vasovagal syncope   [facility], Myrle Sheng, MD                    Requested Prescriptions  Pending Prescriptions Disp Refills   escitalopram (LEXAPRO) 20 MG tablet      Sig: Take 1 tablet (20 mg total) by mouth daily.      Psychiatry:  Antidepressants - SSRI Passed - 02/16/2021  4:18 PM      Passed - Completed PHQ-2 or PHQ-9 in the last 360 days      Passed - Valid encounter within last 6 months    Recent Outpatient Visits           4 months ago Rash and nonspecific skin eruption   [facility] Flinchum, Eula Fried, FNP   9 months ago Depression, major, single episode, moderate (HCC)   [facility] Flinchum, Eula Fried, FNP   10 months ago Encounter for [employer] removal   [facility], Marzella Schlein, MD   11 months ago Annual physical exam   [facility], Eula Fried, FNP   2 years ago Vasovagal syncope   [facility] at [facility], Myrle Sheng, MD

## 2021-02-16 NOTE — Telephone Encounter (Signed)
Medication Refill - Medication: escitalopram (LEXAPRO) 20 MG tablet    Has the patient contacted their pharmacy? Yes.   (Agent: If no, request that the patient contact the pharmacy for the refill.) (Agent: If yes, when and what did the pharmacy advise?)no response from PCP office   Preferred Pharmacy (with phone number or street name): [id number] - [place], [place] - [address]  Agent: Please be advised that RX refills may take up to 3 business days. We ask that you follow-up with your pharmacy.

## 2021-02-17 ENCOUNTER — Telehealth: Payer: Self-pay

## 2021-02-17 NOTE — Telephone Encounter (Signed)
I called pt and we scheduled OV for next Thursday at 1pm. Pt verbalized agreement.

## 2021-02-24 ENCOUNTER — Ambulatory Visit (INDEPENDENT_AMBULATORY_CARE_PROVIDER_SITE_OTHER): Payer: Self-pay | Admitting: Adult Health

## 2021-02-24 DIAGNOSIS — Z91199 Patient's noncompliance with other medical treatment and regimen due to unspecified reason: Secondary | ICD-10-CM | POA: Insufficient documentation

## 2021-02-24 DIAGNOSIS — Z5329 Procedure and treatment not carried out because of patient's decision for other reasons: Secondary | ICD-10-CM

## 2021-02-24 NOTE — Progress Notes (Signed)
      Established patient visit  No show for appointment 02/24/21 per office protocol.

## 2021-03-08 ENCOUNTER — Ambulatory Visit: Payer: BC Managed Care – PPO | Admitting: Psychology

## 2021-03-10 ENCOUNTER — Other Ambulatory Visit: Payer: Self-pay | Admitting: Adult Health

## 2021-03-10 NOTE — Telephone Encounter (Signed)
Requested medication (s) are due for refill today: No  Requested medication (s) are on the active medication list: Yes  Last refill:  02/16/21 # 30  Future visit scheduled: No  Notes to clinic:  Pharmacy requesting 90 day supply.    Requested Prescriptions  Pending Prescriptions Disp Refills   escitalopram (LEXAPRO) 20 MG tablet [Pharmacy Med Name: ESCITALOPRAM 20 MG TABLET] 90 tablet 1    Sig: TAKE 1 TABLET BY MOUTH EVERY DAY      Psychiatry:  Antidepressants - SSRI Passed - 03/10/2021 11:34 AM      Passed - Completed PHQ-2 or PHQ-9 in the last 360 days      Passed - Valid encounter within last 6 months    Recent Outpatient Visits           2 weeks ago No-show for appointment   [facility] Flinchum, Eula Fried, FNP   5 months ago Rash and nonspecific skin eruption   [employer] [facility], Eula Fried, FNP   10 months ago Depression, major, single episode, moderate (HCC)   [facility] Flinchum, Eula Fried, FNP   11 months ago Encounter for [employer] removal   [facility], Marzella Schlein, MD   11 months ago Annual physical exam   [employer], Eula Fried, FNP

## 2021-03-21 ENCOUNTER — Ambulatory Visit: Payer: 59 | Admitting: Adult Health

## 2021-03-22 ENCOUNTER — Ambulatory Visit: Payer: Self-pay | Admitting: Family Medicine

## 2021-03-23 ENCOUNTER — Ambulatory Visit: Payer: BC Managed Care – PPO | Admitting: Neurology

## 2021-03-23 ENCOUNTER — Encounter: Payer: Self-pay | Admitting: Family Medicine

## 2021-03-23 ENCOUNTER — Telehealth: Payer: Self-pay

## 2021-03-23 ENCOUNTER — Other Ambulatory Visit: Payer: Self-pay

## 2021-03-23 ENCOUNTER — Ambulatory Visit (INDEPENDENT_AMBULATORY_CARE_PROVIDER_SITE_OTHER): Payer: BC Managed Care – PPO | Admitting: Family Medicine

## 2021-03-23 VITALS — BP 114/62 | HR 75 | Temp 98.1°F | Ht 64.0 in | Wt 105.0 lb

## 2021-03-23 DIAGNOSIS — F419 Anxiety disorder, unspecified: Secondary | ICD-10-CM

## 2021-03-23 DIAGNOSIS — Z3009 Encounter for other general counseling and advice on contraception: Secondary | ICD-10-CM | POA: Diagnosis not present

## 2021-03-23 DIAGNOSIS — F321 Major depressive disorder, single episode, moderate: Secondary | ICD-10-CM | POA: Diagnosis not present

## 2021-03-23 MED ORDER — ESCITALOPRAM OXALATE 20 MG PO TABS: 1.0000 | ORAL_TABLET | Freq: Every day | ORAL | 1 refills | Status: AC

## 2021-03-23 NOTE — Progress Notes (Signed)
Please see just FYI

## 2021-03-23 NOTE — Patient Instructions (Signed)
Health Maintenance, Female Adopting a healthy lifestyle and getting preventive care are important in promoting health and wellness. Ask your health care provider about:  The right schedule for you to have regular tests and exams.  Things you can do on your own to prevent diseases and keep yourself healthy. What should I know about diet, weight, and exercise? Eat a healthy diet  Eat a diet that includes plenty of vegetables, fruits, low-fat dairy products, and lean protein.  Do not eat a lot of foods that are high in solid fats, added sugars, or sodium.   Maintain a healthy weight Body mass index (BMI) is used to identify weight problems. It estimates body fat based on height and weight. Your health care provider can help determine your BMI and help you achieve or maintain a healthy weight. Get regular exercise Get regular exercise. This is one of the most important things you can do for your health. Most adults should:  Exercise for at least 150 minutes each week. The exercise should increase your heart rate and make you sweat (moderate-intensity exercise).  Do strengthening exercises at least twice a week. This is in addition to the moderate-intensity exercise.  Spend less time sitting. Even light physical activity can be beneficial. Watch cholesterol and blood lipids Have your blood tested for lipids and cholesterol at 23 years of age, then have this test every 5 years. Have your cholesterol levels checked more often if:  Your lipid or cholesterol levels are high.  You are older than 23 years of age.  You are at high risk for heart disease. What should I know about cancer screening? Depending on your health history and family history, you may need to have cancer screening at various ages. This may include screening for:  Breast cancer.  Cervical cancer.  Colorectal cancer.  Skin cancer.  Lung cancer. What should I know about heart disease, diabetes, and high blood  pressure? Blood pressure and heart disease  High blood pressure causes heart disease and increases the risk of stroke. This is more likely to develop in people who have high blood pressure readings, are of African descent, or are overweight.  Have your blood pressure checked: ? Every 3-5 years if you are 18-39 years of age. ? Every year if you are 40 years old or older. Diabetes Have regular diabetes screenings. This checks your fasting blood sugar level. Have the screening done:  Once every three years after age 40 if you are at a normal weight and have a low risk for diabetes.  More often and at a younger age if you are overweight or have a high risk for diabetes. What should I know about preventing infection? Hepatitis B If you have a higher risk for hepatitis B, you should be screened for this virus. Talk with your health care provider to find out if you are at risk for hepatitis B infection. Hepatitis C Testing is recommended for:  Everyone born from 1945 through 1965.  Anyone with known risk factors for hepatitis C. Sexually transmitted infections (STIs)  Get screened for STIs, including gonorrhea and chlamydia, if: ? You are sexually active and are younger than 24 years of age. ? You are older than 24 years of age and your health care provider tells you that you are at risk for this type of infection. ? Your sexual activity has changed since you were last screened, and you are at increased risk for chlamydia or gonorrhea. Ask your health care provider   if you are at risk.  Ask your health care provider about whether you are at high risk for HIV. Your health care provider may recommend a prescription medicine to help prevent HIV infection. If you choose to take medicine to prevent HIV, you should first get tested for HIV. You should then be tested every 3 months for as long as you are taking the medicine. Pregnancy  If you are about to stop having your period (premenopausal) and  you may become pregnant, seek counseling before you get pregnant.  Take 400 to 800 micrograms (mcg) of folic acid every day if you become pregnant.  Ask for birth control (contraception) if you want to prevent pregnancy. Osteoporosis and menopause Osteoporosis is a disease in which the bones lose minerals and strength with aging. This can result in bone fractures. If you are 65 years old or older, or if you are at risk for osteoporosis and fractures, ask your health care provider if you should:  Be screened for bone loss.  Take a calcium or vitamin D supplement to lower your risk of fractures.  Be given hormone replacement therapy (HRT) to treat symptoms of menopause. Follow these instructions at home: Lifestyle  Do not use any products that contain nicotine or tobacco, such as cigarettes, e-cigarettes, and chewing tobacco. If you need help quitting, ask your health care provider.  Do not use street drugs.  Do not share needles.  Ask your health care provider for help if you need support or information about quitting drugs. Alcohol use  Do not drink alcohol if: ? Your health care provider tells you not to drink. ? You are pregnant, may be pregnant, or are planning to become pregnant.  If you drink alcohol: ? Limit how much you use to 0-1 drink a day. ? Limit intake if you are breastfeeding.  Be aware of how much alcohol is in your drink. In the [place] one drink equals one 12 oz bottle of beer (355 mL), one 5 oz glass of wine (148 mL), or one 1 oz glass of hard liquor (44 mL). General instructions  Schedule regular health, dental, and eye exams.  Stay current with your vaccines.  Tell your health care provider if: ? You often feel depressed. ? You have ever been abused or do not feel safe at home. Summary  Adopting a healthy lifestyle and getting preventive care are important in promoting health and wellness.  Follow your health care provider's instructions about healthy  diet, exercising, and getting tested or screened for diseases.  Follow your health care provider's instructions on monitoring your cholesterol and blood pressure. This information is not intended to replace advice given to you by your health care provider. Make sure you discuss any questions you have with your health care provider. Document Revised: 11/06/2018 Document Reviewed: 11/06/2018 Elsevier Patient Education  2021 [employer]vier Inc.  

## 2021-03-23 NOTE — Telephone Encounter (Signed)
[facility] referring for nexplanon. Patient is scheduled for 04/05/21 at 10:10 with JEG

## 2021-03-23 NOTE — Progress Notes (Signed)
Established patient visit   Patient: Pamela Farmer   DOB: [birth year]   24 y.o. Female  MRN: [id number] Visit Date: 03/23/2021  Today's healthcare provider: Azalee Course Benedicta Sultan, FNP   Chief Complaint  Patient presents with  . Anxiety   Subjective    HPI  Anxiety, Follow-up  She was last seen for anxiety 8 months ago. Changes made at last visit include no changes.   She reports excellent compliance with treatment. She reports excellent tolerance of treatment. She is not having side effects.   Has been on Lexapro since 20 No issues with that dose  Symptoms: No chest pain No difficulty concentrating  No dizziness No fatigue  No feelings of losing control No insomnia  No irritable No palpitations  No panic attacks No racing thoughts  No shortness of breath No sweating  No tremors/shakes    GAD-7 Results GAD-7 Generalized Anxiety Disorder Screening Tool 03/19/2018 07/18/2017  1. Feeling Nervous, Anxious, or on Edge 3 1  2. Not Being Able to Stop or Control Worrying 1 1  3. Worrying Too Much About Different Things 1 1  4. Trouble Relaxing 1 3  5. Being So Restless it's Hard To Sit Still 1 2  6. Becoming Easily Annoyed or Irritable 1 2  7. Feeling Afraid As If Something Awful Might Happen 1 1  Total GAD-7 Score 9 11  Difficulty At Work, Home, or Getting  Along With Others? - Somewhat difficult    PHQ-9 Scores PHQ9 SCORE ONLY 09/20/2020 05/13/2020 03/22/2020  PHQ-9 Total Score 10 9 23     ---------------------------------------------------------------------------------------------------   Patient Active Problem List   Diagnosis Date Noted  . No-show for appointment 02/24/2021  . Rash and nonspecific skin eruption 09/20/2020  . History of head injury 09/20/2020  . Numbness 09/20/2020  . Family history of multiple sclerosis 09/20/2020  . Depression, major, single episode, moderate (HCC) 05/13/2020  . Gynecologic disease 05/13/2020  . Screening for cervical cancer  05/13/2020  . POTS (postural orthostatic tachycardia syndrome) 04/23/2020  . Syncope 04/23/2020  . Underweight 03/22/2020  . Vitamin D insufficiency 03/22/2020  . Depression, recurrent (HCC) 03/22/2020  . Menstrual spotting 03/22/2020  . Body mass index (BMI) of 19 or less in adult 03/22/2020  . Possible urinary tract infection 03/22/2020  . Leukocytes in urine 03/22/2020  . Psychophysiological insomnia 07/19/2017  . Syncope, vasovagal 07/19/2017  . Tachycardia 07/19/2017  . Dizziness 09/25/2016  . Chronic pain of both knees 09/25/2016  . Anxiety 11/08/2015  . Birth control counseling 11/08/2015  . ADD (attention deficit hyperactivity disorder, inattentive type) 01/14/2009   Past Medical History:  Diagnosis Date  . Anxiety   . Cataract   . Depression   . Vasovagal near syncope    Self reported   Social History   Tobacco Use  . Smoking status: Never Smoker  . Smokeless tobacco: Never Used  Substance Use Topics  . Alcohol use: Yes    Comment: rare  . Drug use: No   No Known Allergies   Medications: Outpatient Medications Prior to Visit  Medication Sig  . fludrocortisone (FLORINEF) 0.1 MG tablet Take 1 tablet (0.1 mg total) by mouth daily. Take 1/2 to 1 pill daily  . [DISCONTINUED] escitalopram (LEXAPRO) 20 MG tablet TAKE 1 TABLET BY MOUTH EVERY DAY  . [DISCONTINUED] triamcinolone cream (KENALOG) 0.1 % Apply 1 application topically 2 (two) times daily. Very thin layer to affected to areas only. Not for face or genitals. (Patient taking  differently: Apply 1 application topically 2 (two) times daily. Very thin layer to affected to areas only. Not for face or genitals. AS NEEDED)   No facility-administered medications prior to visit.    Review of Systems  Constitutional: Negative.   Respiratory: Negative.   Cardiovascular: Negative.   Gastrointestinal: Negative.   Neurological: Negative for dizziness, light-headedness and headaches.  Psychiatric/Behavioral: Negative.          Objective    BP 114/62 (BP Location: Right Arm, Patient Position: Sitting, Cuff Size: Normal)   Pulse 75   Temp 98.1 F (36.7 C) (Oral)   Ht 5\' 4"  (1.626 m)   Wt 105 lb (47.6 kg)   BMI 18.02 kg/m    Physical Exam Constitutional:      General: She is not in acute distress.    Appearance: Normal appearance. She is not ill-appearing.  HENT:     Head: Normocephalic.  Cardiovascular:     Rate and Rhythm: Normal rate and regular rhythm.     Pulses: Normal pulses.     Heart sounds: Normal heart sounds. No murmur heard. No friction rub. No gallop.   Pulmonary:     Effort: Pulmonary effort is normal. No respiratory distress.     Breath sounds: Normal breath sounds. No stridor. No wheezing, rhonchi or rales.  Abdominal:     General: Bowel sounds are normal.     Palpations: Abdomen is soft.     Tenderness: There is no abdominal tenderness.  Musculoskeletal:     Right lower leg: No edema.     Left lower leg: No edema.  Skin:    General: Skin is warm and dry.  Neurological:     Mental Status: She is alert and oriented to person, place, and time.  Psychiatric:        Mood and Affect: Mood normal.        Behavior: Behavior normal.       No results found for any visits on 03/23/21.  Assessment & Plan     Problem List Items Addressed This Visit      Other   Anxiety - Primary   Relevant Medications   escitalopram (LEXAPRO) 20 MG tablet   Birth control counseling   Relevant Orders   Ambulatory referral to Gynecology   Depression, major, single episode, moderate (HCC)   Relevant Medications   escitalopram (LEXAPRO) 20 MG tablet     Medication refills sent, r/se/b of medications discussed Referral placed to GYN  Return in about 6 months (around 09/22/2021).      09/24/2021 Aicha Clingenpeel, FNP  [facility] [phone] (phone) [phone] (fax)  [facility]

## 2021-03-28 NOTE — Telephone Encounter (Signed)
Noted. Nexplanon reserved for this patients apt 03/31/21 per EPIC

## 2021-03-31 ENCOUNTER — Other Ambulatory Visit: Payer: Self-pay

## 2021-03-31 ENCOUNTER — Ambulatory Visit: Payer: BC Managed Care – PPO | Admitting: Advanced Practice Midwife

## 2021-03-31 ENCOUNTER — Encounter: Payer: Self-pay | Admitting: Advanced Practice Midwife

## 2021-03-31 VITALS — BP 100/60 | Ht 64.0 in | Wt 104.0 lb

## 2021-03-31 DIAGNOSIS — Z30017 Encounter for initial prescription of implantable subdermal contraceptive: Secondary | ICD-10-CM | POA: Diagnosis not present

## 2021-03-31 DIAGNOSIS — J309 Allergic rhinitis, unspecified: Secondary | ICD-10-CM | POA: Insufficient documentation

## 2021-03-31 NOTE — Progress Notes (Signed)
GYNECOLOGY PROCEDURE NOTE  Patient is a 25 y.o. G0P0000 presenting for Nexplanon insertion as her desired means of contraception.  She provided informed consent, signed copy in the chart, time out was performed. Self reported LMP of Patient's last menstrual period was 03/28/2021. She has had 2 previous Nexplanons.   She understands that Nexplanon is a progesterone only therapy, and that patients often have irregular and unpredictable vaginal bleeding or amenorrhea. She understands that other side effects are possible related to systemic progesterone, including but not limited to, headaches, breast tenderness, nausea, and irritability. While effective at preventing pregnancy long acting reversible contraceptives do not prevent transmission of sexually transmitted diseases and use of barrier methods for this purpose was discussed. The placement procedure for Nexplanon was reviewed with the patient in detail including risks of nerve injury, infection, bleeding and injury to other muscles or tendons. She understands that the Nexplanon implant is good for 3 years and needs to be removed at the end of that time.  She understands that Nexplanon is an extremely effective option for contraception, with failure rate of <1%. This information is reviewed today and all questions were answered. Informed consent was obtained, both verbally and written.   Review of Systems  Constitutional: Negative for chills and fever.  HENT: Negative for congestion, ear discharge, ear pain, hearing loss, sinus pain and sore throat.   Eyes: Negative for blurred vision and double vision.  Respiratory: Negative for cough, shortness of breath and wheezing.   Cardiovascular: Negative for chest pain, palpitations and leg swelling.  Gastrointestinal: Negative for abdominal pain, blood in stool, constipation, diarrhea, heartburn, melena, nausea and vomiting.  Genitourinary: Negative for dysuria, flank pain, frequency, hematuria and  urgency.  Musculoskeletal: Negative for back pain, joint pain and myalgias.  Skin: Negative for itching and rash.  Neurological: Negative for dizziness, tingling, tremors, sensory change, speech change, focal weakness, seizures, loss of consciousness, weakness and headaches.  Endo/Heme/Allergies: Negative for environmental allergies. Does not bruise/bleed easily.  Psychiatric/Behavioral: Negative for depression, hallucinations, memory loss, substance abuse and suicidal ideas. The patient is not nervous/anxious and does not have insomnia.     The patient is healthy and has no contraindications to Nexplanon use. Urine pregnancy test was performed today and was negative.   Vital Signs: BP 100/60   Ht 5\' 4"  (1.626 m)   Wt 104 lb (47.2 kg)   LMP 03/28/2021   BMI 17.85 kg/m  Constitutional: Well nourished, well developed female in no acute distress.  HEENT: normal Skin: Warm and dry.  Cardiovascular: Regular rate and rhythm.   Extremity: no edema Respiratory: Clear to auscultation bilateral. Normal respiratory effort Neuro: DTRs 2+, Cranial nerves grossly intact Psych: Alert and Oriented x3. No memory deficits. Normal mood and affect.  MS: normal gait, normal bilateral lower extremity ROM/strength/stability.    Procedure Appropriate time out taken.  Patient placed in dorsal supine with left arm above head, elbow flexed at 90 degrees, arm resting on examination table.  Previous site of insertion identified and the site was prepped with a two betadine swabs and then injected with 2 ml of 1% lidocaine without epinephrine.  Nexplanon removed form sterile blister packaging,  Device confirmed in needle, before inserting full length of needle, tenting up the skin as the needle was advanced.  The drug eluding rod was then deployed by pulling back the slider per the manufactures recommendation.  The implant was palpable by the clinician as well as the patient.  The insertion site  was  dressed with a  steri strip and band aid before applying a kerlex bandage pressure dressing..Minimal blood loss was noted during the procedure.  The patient tolerated the procedure well.   She was instructed to wear the bandage for 24 hours, call with any signs of infection.  She was given the Nexplanon card and instructed to have the rod removed in 3 years.   Parke Poisson, CNM [facility] 03/31/21, 11:07 AM    Charge [phone] for nexplanon device, CPT [phone] for procedure J2001 for lidocaine administration Modifer 25, plus Modifer 79 is done during a global billing visit

## 2021-09-19 NOTE — Telephone Encounter (Signed)
Nexplanon rcvd/charged 03/31/21

## 2021-09-22 ENCOUNTER — Ambulatory Visit: Payer: BC Managed Care – PPO | Admitting: Family Medicine

## 2024-09-05 ENCOUNTER — Encounter: Payer: Self-pay | Admitting: Licensed Practical Nurse
